# Patient Record
Sex: Male | Born: 1955 | Race: White | Hispanic: No | Marital: Single | Smoking: Never smoker
Health system: Southern US, Community
[De-identification: ages and names within clinical notes are randomized; demographics above are authoritative.]

## PROBLEM LIST (undated history)

## (undated) DIAGNOSIS — I10 Essential (primary) hypertension: Secondary | ICD-10-CM

## (undated) DIAGNOSIS — E119 Type 2 diabetes mellitus without complications: Secondary | ICD-10-CM

## (undated) DIAGNOSIS — I6529 Occlusion and stenosis of unspecified carotid artery: Secondary | ICD-10-CM

## (undated) DIAGNOSIS — E785 Hyperlipidemia, unspecified: Secondary | ICD-10-CM

## (undated) HISTORY — DX: Occlusion and stenosis of unspecified carotid artery: I65.29

## (undated) HISTORY — DX: Type 2 diabetes mellitus without complications: E11.9

## (undated) HISTORY — DX: Hyperlipidemia, unspecified: E78.5

---
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
## 2014-02-04 ENCOUNTER — Emergency Department (HOSPITAL_COMMUNITY): Payer: BLUE CROSS/BLUE SHIELD

## 2014-02-04 ENCOUNTER — Emergency Department (HOSPITAL_COMMUNITY)
Admission: EM | Admit: 2014-02-04 | Discharge: 2014-02-04 | Disposition: A | Discharge status: Home / Self Care | Payer: BLUE CROSS/BLUE SHIELD | Attending: Emergency Medicine | Admitting: Emergency Medicine

## 2014-02-04 ENCOUNTER — Encounter (HOSPITAL_COMMUNITY): Payer: Self-pay | Admitting: Emergency Medicine

## 2014-02-04 DIAGNOSIS — R1900 Intra-abdominal and pelvic swelling, mass and lump, unspecified site: Secondary | ICD-10-CM

## 2014-02-04 DIAGNOSIS — R000 Tachycardia, unspecified: Secondary | ICD-10-CM | POA: Insufficient documentation

## 2014-02-04 DIAGNOSIS — B029 Zoster without complications: Secondary | ICD-10-CM | POA: Diagnosis not present

## 2014-02-04 DIAGNOSIS — I10 Essential (primary) hypertension: Secondary | ICD-10-CM | POA: Insufficient documentation

## 2014-02-04 DIAGNOSIS — Z79899 Other long term (current) drug therapy: Secondary | ICD-10-CM | POA: Diagnosis not present

## 2014-02-04 DIAGNOSIS — R1032 Left lower quadrant pain: Secondary | ICD-10-CM | POA: Diagnosis present

## 2014-02-04 HISTORY — DX: Essential (primary) hypertension: I10

## 2014-02-04 LAB — COMPREHENSIVE METABOLIC PANEL
ALBUMIN: 4.3 g/dL (ref 3.5–5.2)
ALK PHOS: 55 U/L (ref 39–117)
ALT: 37 U/L (ref 0–53)
AST: 27 U/L (ref 0–37)
Anion gap: 9 (ref 5–15)
BILIRUBIN TOTAL: 0.8 mg/dL (ref 0.3–1.2)
BUN: 12 mg/dL (ref 6–23)
CALCIUM: 9.5 mg/dL (ref 8.4–10.5)
CHLORIDE: 105 mmol/L (ref 96–112)
CO2: 23 mmol/L (ref 19–32)
CREATININE: 1.08 mg/dL (ref 0.50–1.35)
GFR calc Af Amer: 85 mL/min — ABNORMAL LOW (ref >90)
GFR calc non Af Amer: 73 mL/min — ABNORMAL LOW (ref >90)
Glucose, Bld: 109 mg/dL — ABNORMAL HIGH (ref 70–99)
POTASSIUM: 4 mmol/L (ref 3.5–5.1)
Sodium: 137 mmol/L (ref 135–145)
Total Protein: 7.4 g/dL (ref 6.0–8.3)

## 2014-02-04 LAB — CBC WITH DIFFERENTIAL/PLATELET
Basophils Absolute: 0.1 10*3/uL (ref 0.0–0.1)
Basophils Relative: 1 % (ref 0–1)
EOS ABS: 0.2 10*3/uL (ref 0.0–0.7)
Eosinophils Relative: 2 % (ref 0–5)
HEMATOCRIT: 37.1 % — AB (ref 39.0–52.0)
HEMOGLOBIN: 13.1 g/dL (ref 13.0–17.0)
Lymphocytes Relative: 22 % (ref 12–46)
Lymphs Abs: 1.9 10*3/uL (ref 0.7–4.0)
MCH: 29.7 pg (ref 26.0–34.0)
MCHC: 35.3 g/dL (ref 30.0–36.0)
MCV: 84.1 fL (ref 78.0–100.0)
MONO ABS: 0.7 10*3/uL (ref 0.1–1.0)
Monocytes Relative: 8 % (ref 3–12)
NEUTROS ABS: 5.8 10*3/uL (ref 1.7–7.7)
NEUTROS PCT: 67 % (ref 43–77)
Platelets: 230 10*3/uL (ref 150–400)
RBC: 4.41 MIL/uL (ref 4.22–5.81)
RDW: 13.3 % (ref 11.5–15.5)
WBC: 8.7 10*3/uL (ref 4.0–10.5)

## 2014-02-04 LAB — I-STAT CHEM 8, ED
BUN: 15 mg/dL (ref 6–23)
CREATININE: 1.3 mg/dL (ref 0.50–1.35)
Calcium, Ion: 1.24 mmol/L — ABNORMAL HIGH (ref 1.12–1.23)
Chloride: 103 mmol/L (ref 96–112)
Glucose, Bld: 106 mg/dL — ABNORMAL HIGH (ref 70–99)
HCT: 42 % (ref 39.0–52.0)
HEMOGLOBIN: 14.3 g/dL (ref 13.0–17.0)
Potassium: 4.1 mmol/L (ref 3.5–5.1)
Sodium: 141 mmol/L (ref 135–145)
TCO2: 20 mmol/L (ref 0–100)

## 2014-02-04 LAB — URINALYSIS, ROUTINE W REFLEX MICROSCOPIC
Bilirubin Urine: NEGATIVE
Glucose, UA: NEGATIVE mg/dL
HGB URINE DIPSTICK: NEGATIVE
Ketones, ur: NEGATIVE mg/dL
LEUKOCYTES UA: NEGATIVE
NITRITE: NEGATIVE
PROTEIN: NEGATIVE mg/dL
SPECIFIC GRAVITY, URINE: 1.008 (ref 1.005–1.030)
Urobilinogen, UA: 0.2 mg/dL (ref 0.0–1.0)
pH: 5.5 (ref 5.0–8.0)

## 2014-02-04 MED ORDER — IOHEXOL 300 MG/ML  SOLN
25.0000 mL | Freq: Once | INTRAMUSCULAR | Status: AC | PRN
  Administered 2014-02-04: 25 mL via ORAL

## 2014-02-04 MED ORDER — IOHEXOL 300 MG/ML  SOLN
80.0000 mL | Freq: Once | INTRAMUSCULAR | Status: AC | PRN
  Administered 2014-02-04: 80 mL via INTRAVENOUS

## 2014-02-04 NOTE — ED Provider Notes (Addendum)
CSN: [id number]     Arrival date & time 02/04/14  4098 History   First MD Initiated Contact with Patient 02/04/14 [phone]     Chief Complaint  Patient presents with  . Abdominal Pain     (Consider location/radiation/quality/duration/timing/severity/associated sxs/prior Treatment) Patient is a 59 y.o. male presenting with abdominal pain. The history is provided by the patient.  Abdominal Pain Pain location:  LLQ Pain quality: sharp and shooting   Pain radiates to:  Groin Pain severity:  Severe Onset quality:  Gradual Duration:  2 weeks Timing:  Constant Progression:  Unchanged Chronicity:  New Context comment:  Patient noticed a rash 2 weeks ago to the left side of his abdomen and was diagnosed shingles. Since that time however he is noticing worsening swelling to the left side of his abdomen not improve with antibiotics which he took a full course or time Relieved by: Narcotics. Worsened by:  Movement Associated symptoms: diarrhea   Associated symptoms: no anorexia, no chills, no constipation, no cough, no nausea, no shortness of breath and no vomiting   Associated symptoms comment:  Only one episode of diarrhea but none since then Risk factors: no alcohol abuse and no recent hospitalization     Past Medical History  Diagnosis Date  . Hypertension    History reviewed. No pertinent past surgical history. No family history on file. History  Substance Use Topics  . Smoking status: Never Smoker   . Smokeless tobacco: Not on file  . Alcohol Use: No    Review of Systems  Constitutional: Negative for chills.  Respiratory: Negative for cough and shortness of breath.   Gastrointestinal: Positive for abdominal pain and diarrhea. Negative for nausea, vomiting, constipation and anorexia.  All other systems reviewed and are negative.     Allergies  Review of patient's allergies indicates no known allergies.  Home Medications   Prior to Admission medications   Medication Sig  Start Date End Date Taking? Authorizing Provider  amLODipine (NORVASC) 10 MG tablet Take 10 mg by mouth daily.   Yes Historical Provider, MD  atorvastatin (LIPITOR) 20 MG tablet Take 20 mg by mouth daily.   Yes Historical Provider, MD  POTASSIUM CHLORIDE PO Take 3 tablets by mouth daily.   Yes Historical Provider, MD  sertraline (ZOLOFT) 50 MG tablet Take 50 mg by mouth daily.   Yes Historical Provider, MD  valsartan (DIOVAN) 320 MG tablet Take 320 mg by mouth daily.   Yes Historical Provider, MD  PRESCRIPTION MEDICATION Take 1 tablet by mouth daily. Sertraline and cholesyerol    Historical Provider, MD   BP 125/83 mmHg  Pulse 104  Temp(Src) 98.3 F (36.8 C) (Oral)  Resp 20  SpO2 95% Physical Exam  Constitutional: He is oriented to person, place, and time. He appears well-developed and well-nourished. No distress.  HENT:  Head: Normocephalic and atraumatic.  Mouth/Throat: Oropharynx is clear and moist.  Eyes: Conjunctivae and EOM are normal. Pupils are equal, round, and reactive to light.  Neck: Normal range of motion. Neck supple.  Cardiovascular: Regular rhythm and intact distal pulses.  Tachycardia present.   No murmur heard. Pulmonary/Chest: Effort normal and breath sounds normal. No respiratory distress. He has no wheezes. He has no rales.  Abdominal: Soft. He exhibits no distension. There is no splenomegaly or hepatomegaly. There is no tenderness. There is no rebound and no guarding.    Musculoskeletal: Normal range of motion. He exhibits no edema or tenderness.  Neurological: He is alert and oriented  to person, place, and time.  Skin: Skin is warm and dry. No rash noted. No erythema.  Psychiatric: He has a normal mood and affect. His behavior is normal.  Nursing note and vitals reviewed.   ED Course  Procedures (including critical care time) Labs Review Labs Reviewed  CBC WITH DIFFERENTIAL/PLATELET - Abnormal; Notable for the following:    HCT 37.1 (*)    All other  components within normal limits  COMPREHENSIVE METABOLIC PANEL - Abnormal; Notable for the following:    Glucose, Bld 109 (*)    GFR calc non Af Amer 73 (*)    GFR calc Af Amer 85 (*)    All other components within normal limits  I-STAT CHEM 8, ED - Abnormal; Notable for the following:    Glucose, Bld 106 (*)    Calcium, Ion 1.24 (*)    All other components within normal limits  URINALYSIS, ROUTINE W REFLEX MICROSCOPIC    Imaging Review Ct Abdomen Pelvis W Contrast  02/04/2014   CLINICAL DATA:  LEFT side abdominal pain and swelling for several days, head diarrhea last week, active shingles LEFT side of abdomen, history hypertension  EXAM: CT ABDOMEN AND PELVIS WITH CONTRAST  TECHNIQUE: Multidetector CT imaging of the abdomen and pelvis was performed using the standard protocol following bolus administration of intravenous contrast. Sagittal and coronal MPR images reconstructed from axial data set.  CONTRAST:  80mL OMNIPAQUE IOHEXOL 300 MG/ML SOLN IV. Dilute oral contrast.  COMPARISON:  None  FINDINGS: 7 mm nodule base of RIGHT middle lobe image 1.  Liver, spleen, pancreas, kidneys, and adrenal glands normal.  Unremarkable bladder and ureters.  Few prostatic calcifications.  Scattered atherosclerotic calcifications without aneurysm.  Tiny LEFT inguinal hernia containing fat.  Stomach and bowel loops unremarkable.  Appendix not identified.  No mass, adenopathy, free fluid or inflammatory process.  Numerous scattered pelvic phleboliths.  No acute osseous findings.  IMPRESSION: No acute intra-abdominal or intrapelvic abnormalities.  Tiny LEFT inguinal hernia containing fat.  7 mm RIGHT lower lobe nodule, recommendation below.  If the patient is at high risk for bronchogenic carcinoma, follow-up chest CT at 3-356months is recommended. If the patient is at low risk for bronchogenic carcinoma, follow-up chest CT at 6-12 months is recommended. This recommendation follows the consensus statement: Guidelines for  Management of Small Pulmonary Nodules Detected on CT Scans: A Statement from the Fleischner Society as published in Radiology 2005; 237:395-400.   Electronically Signed   By: Ulyses SouthwardMark  Boles M.D.   On: 02/04/2014 11:48     EKG Interpretation None      MDM   Final diagnoses:  Abdominal swelling  Shingles    Patient with evidence of left-sided shingles that he's had for approximately 2 weeks presenting with left-sided abdominal swelling. Patient has significant swelling to the left side of his abdomen without evidence of abscess or fluid collection. Unable to palpate his spleen however given the extent of the swelling to his left side will do a CT to further evaluate. He denies any urinary or bowel symptoms. No fever at this time. No testicle or penis complaints.  12:01 PM Labs and CT neg.  Most likely loss of muscle tone caused by the shingles.  Will have pt continue valtrex and pain meds and f/u with pcp Patient is not a smoker and is at low risk for bronchogenic carcinoma she'll need a follow-up CT in 6-12 months.  Gwyneth SproutWhitney Antavia Tandy, MD 02/04/14 1201  Gwyneth SproutWhitney Marikay Roads, MD 02/04/14 [phone]  Gwyneth Sprout, MD 02/04/14 [phone]

## 2014-02-04 NOTE — ED Notes (Signed)
C/o left sided abd pain and swelling for several days, no vomiting or fever, diarrhea last week, also has active shingles on left side of abd, A/O X4, ambulatory and in NAD

## 2015-06-09 ENCOUNTER — Other Ambulatory Visit: Payer: Self-pay | Admitting: Family Medicine

## 2015-06-09 DIAGNOSIS — R911 Solitary pulmonary nodule: Secondary | ICD-10-CM

## 2015-07-14 ENCOUNTER — Ambulatory Visit
Admission: RE | Admit: 2015-07-14 | Discharge: 2015-07-14 | Disposition: A | Discharge status: Home / Self Care | Payer: BLUE CROSS/BLUE SHIELD | Source: Ambulatory Visit | Attending: Family Medicine | Admitting: Family Medicine

## 2015-07-14 DIAGNOSIS — R911 Solitary pulmonary nodule: Secondary | ICD-10-CM

## 2017-06-13 ENCOUNTER — Emergency Department (HOSPITAL_COMMUNITY): Payer: PRIVATE HEALTH INSURANCE

## 2017-06-13 ENCOUNTER — Encounter (HOSPITAL_COMMUNITY): Payer: Self-pay | Admitting: Emergency Medicine

## 2017-06-13 ENCOUNTER — Emergency Department (HOSPITAL_COMMUNITY)
Admission: EM | Admit: 2017-06-13 | Discharge: 2017-06-13 | Disposition: A | Discharge status: Home / Self Care | Payer: PRIVATE HEALTH INSURANCE | Attending: Emergency Medicine | Admitting: Emergency Medicine

## 2017-06-13 ENCOUNTER — Other Ambulatory Visit: Payer: Self-pay

## 2017-06-13 DIAGNOSIS — M25562 Pain in left knee: Secondary | ICD-10-CM | POA: Diagnosis not present

## 2017-06-13 DIAGNOSIS — I10 Essential (primary) hypertension: Secondary | ICD-10-CM | POA: Diagnosis not present

## 2017-06-13 DIAGNOSIS — Z79899 Other long term (current) drug therapy: Secondary | ICD-10-CM | POA: Insufficient documentation

## 2017-06-13 DIAGNOSIS — M25512 Pain in left shoulder: Secondary | ICD-10-CM | POA: Insufficient documentation

## 2017-06-13 DIAGNOSIS — M1710 Unilateral primary osteoarthritis, unspecified knee: Secondary | ICD-10-CM

## 2017-06-13 NOTE — ED Triage Notes (Signed)
Pt. Stated, Jimmy Callahan had groin pain for 2 weeks and my left knee is painful. I have no strength in my left shoulder

## 2017-06-13 NOTE — Discharge Instructions (Addendum)
Follow up as discussed. Take ibuprofen as discussed

## 2017-06-13 NOTE — ED Provider Notes (Signed)
[facility] EMERGENCY DEPARTMENT Provider Note   CSN: [id number] Arrival date & time: 06/13/17  6578     History   Chief Complaint Chief Complaint  Patient presents with  . Groin Pain  . Shoulder Pain    HPI Jimmy Callahan is a 62 y.o. male.  Pt comes in with c/o left shoulder, left knee and right groin pain that has been going on for a couple of weeks. 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 He has been taking ibuprofen. He states that on day he doesn't work the symptoms seem better. Denies redness, swelling or warmth to the left leg     Past Medical History:  Diagnosis Date  . Hypertension     There are no active problems to display for this patient.   History reviewed. No pertinent surgical history.      Home Medications    Prior to Admission medications   Medication Sig Start Date End Date Taking? Authorizing Provider  amLODipine (NORVASC) 10 MG tablet Take 10 mg by mouth daily.    [provider]  atorvastatin (LIPITOR) 20 MG tablet Take 20 mg by mouth daily.    [provider]  POTASSIUM CHLORIDE PO Take 3 tablets by mouth daily.    [provider]  PRESCRIPTION MEDICATION Take 1 tablet by mouth daily. Sertraline and cholesyerol    [provider]  sertraline (ZOLOFT) 50 MG tablet Take 50 mg by mouth daily.    [provider]  valsartan (DIOVAN) 320 MG tablet Take 320 mg by mouth daily.    [provider]    Family History No family history on file.  Social History Social History   Tobacco Use  . Smoking status: Never Smoker  . Smokeless tobacco: Never Used  Substance Use Topics  . Alcohol use: No  . Drug use: Not Currently     Allergies   Patient has no known allergies.   Review of Systems Review of Systems  All other systems reviewed and are negative.    Physical Exam Updated Vital Signs BP 122/87 (BP Location: Right Arm)    Pulse 100   Temp 98.2 F (36.8 C) (Oral)   Resp 16   Ht 5\' 11"  (1.803 m)   Wt 93 kg (205 lb)   SpO2 99%   BMI 28.59 kg/m   Physical Exam  Constitutional: He is oriented to person, place, and time. He appears well-developed and well-nourished.  Eyes: Pupils are equal, round, and reactive to light. EOM are normal.  Cardiovascular: Normal rate.  Pulmonary/Chest: Effort normal and breath sounds normal.  Abdominal: Soft. Bowel sounds are normal.  Musculoskeletal: Normal range of motion. He exhibits no edema.  Neurological: He is alert and oriented to person, place, and time.  Skin: Skin is warm.  Psychiatric: He has a normal mood and affect.  Nursing note and vitals reviewed.    ED Treatments / Results  Labs (all labs ordered are listed, but only abnormal results are displayed) Labs Reviewed - No data to display  EKG None  Radiology No results found.  Procedures Procedures (including critical care time)  Medications Ordered in ED Medications - No data to display   Initial Impression / Assessment and Plan / ED Course  I have reviewed the triage vital signs and the nursing notes.  Pertinent labs & imaging results that were available during my care of the patient were reviewed by me  and considered in my medical decision making (see chart for details).     Likely degenerative  Final Clinical Impressions(s) / ED Diagnoses   Final diagnoses:  None    ED Discharge Orders    None       Teressa Lowerickering, Janyiah Silveri, NP 06/13/17 1158    Donnetta Hutchingook, Brian, MD 06/15/17 1040

## 2017-07-22 DIAGNOSIS — R4020 Unspecified coma: Secondary | ICD-10-CM | POA: Diagnosis not present

## 2017-07-22 DIAGNOSIS — T50904A Poisoning by unspecified drugs, medicaments and biological substances, undetermined, initial encounter: Secondary | ICD-10-CM | POA: Diagnosis not present

## 2017-07-22 DIAGNOSIS — R0689 Other abnormalities of breathing: Secondary | ICD-10-CM | POA: Diagnosis not present

## 2017-09-12 IMAGING — CT CT CHEST W/O CM
1 series · 16 of 34 positions shown, 20 images · non-contrast
Comparison: CT scan of February 04, 2014.

CLINICAL DATA: Right lung nodule.

EXAM:
CT CHEST WITHOUT CONTRAST
TECHNIQUE: Multidetector CT imaging of the chest was performed following the
standard protocol without IV contrast.

[Series 2: chest w/(date) · axial · 0.80mm/px · z∈[-408,-76]mm · 16 of 187 slices shown, 20 images]
[im 14/187  mediastinal]
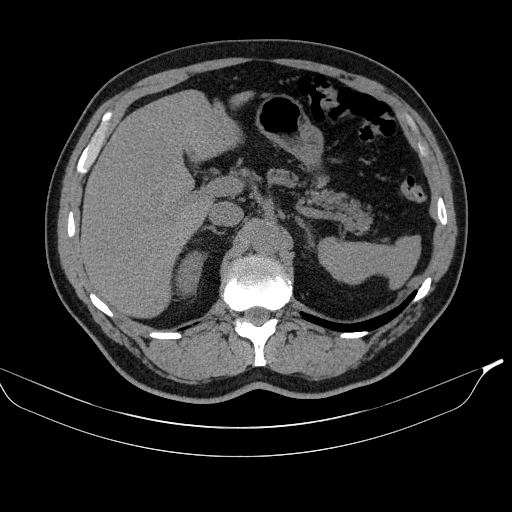
[im 14/187  lung]
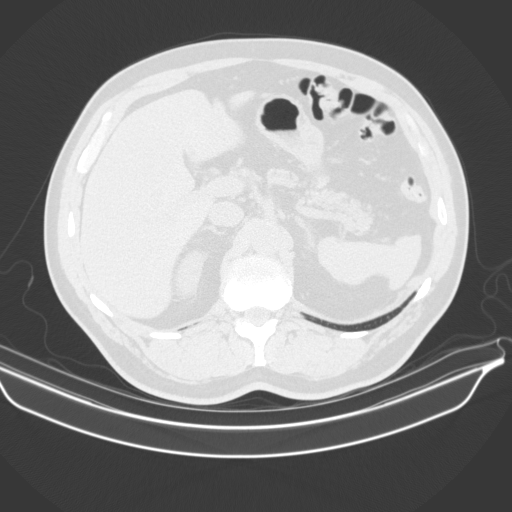
[im 28/187  lung]
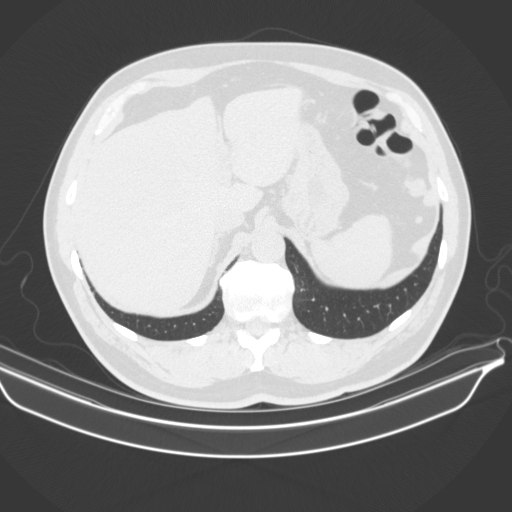
[im 38/187  lung]
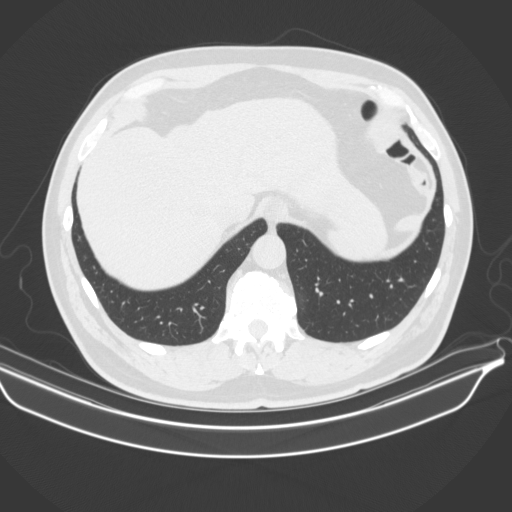
[im 49/187  lung]
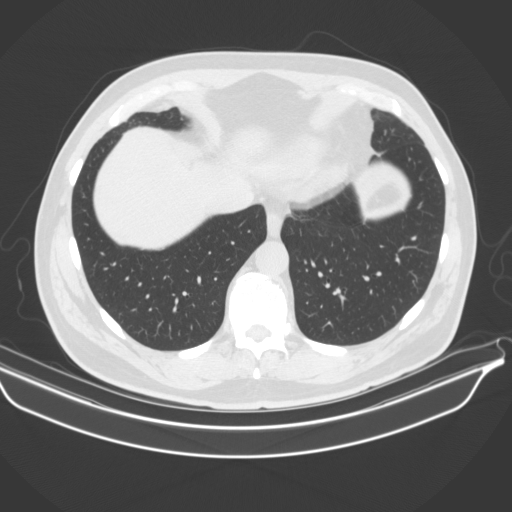
[im 63/187  mediastinal]
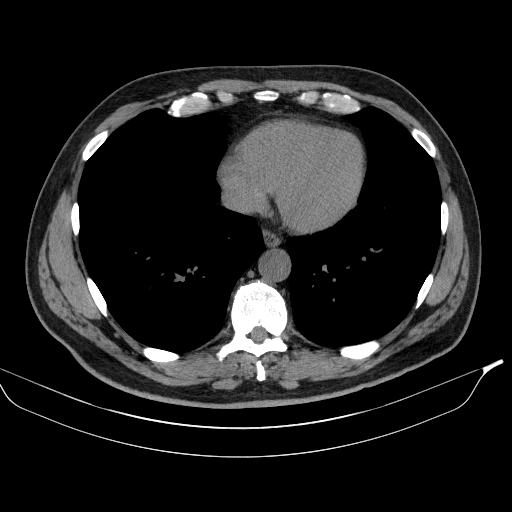
[im 63/187  lung]
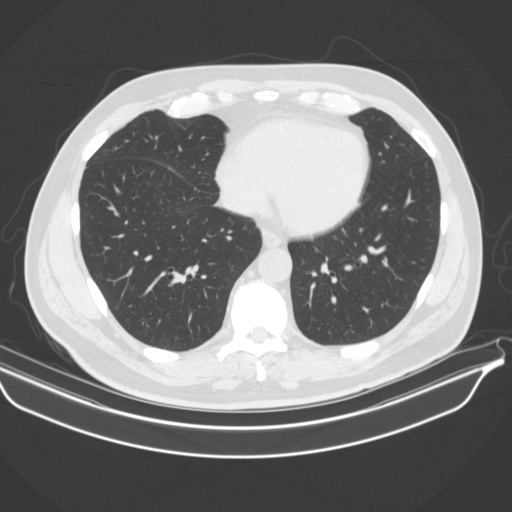
[im 75/187  lung]
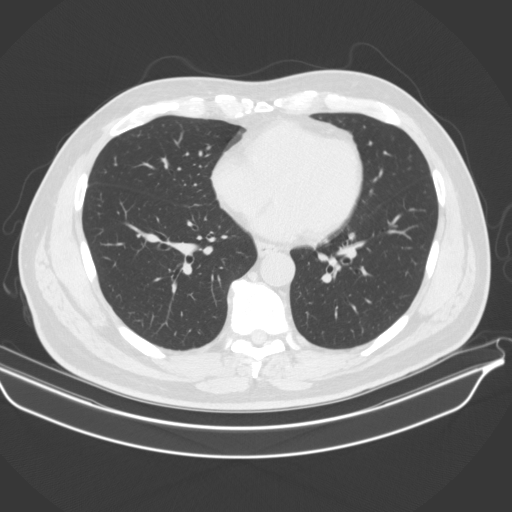
[im 83/187  lung]
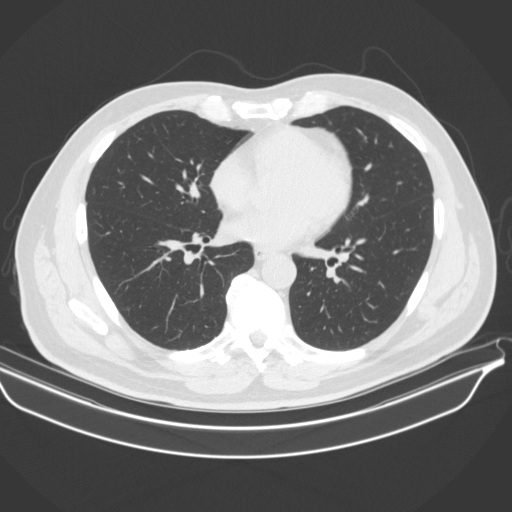
[im 90/187  lung]
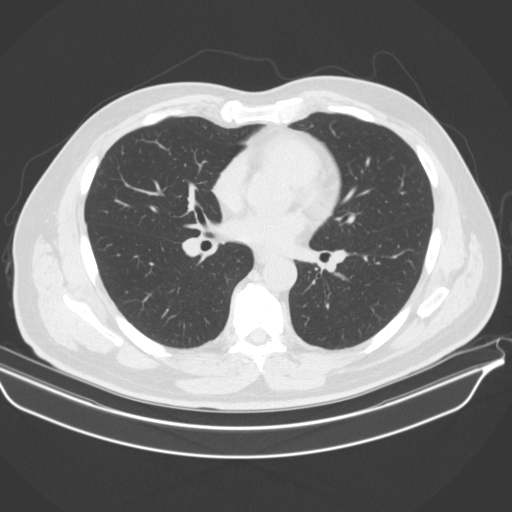
[im 98/187  mediastinal]
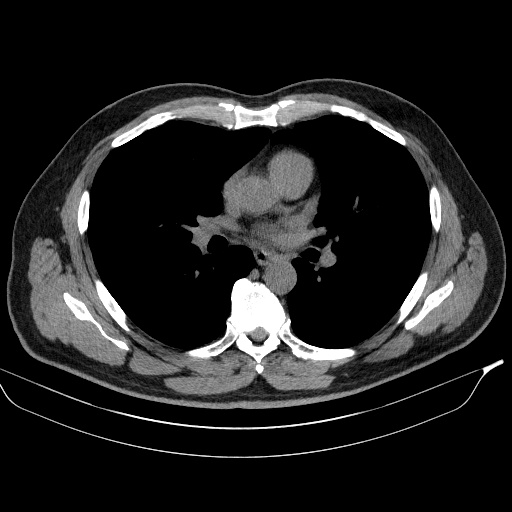
[im 98/187  lung]
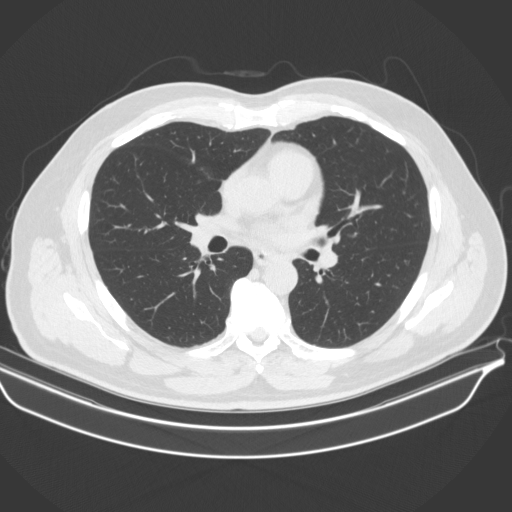
[im 111/187  lung]
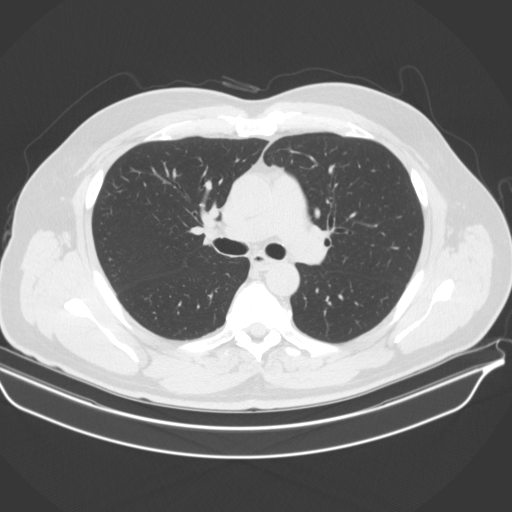
[im 118/187  lung]
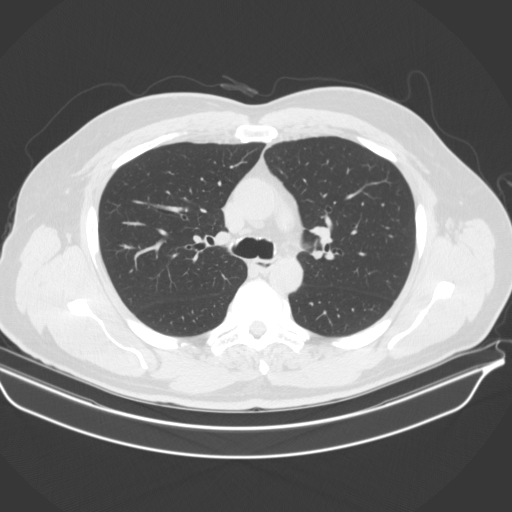
[im 131/187  lung]
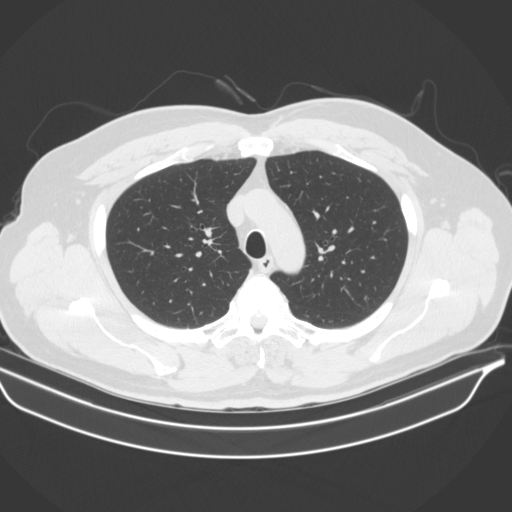
[im 145/187  mediastinal]
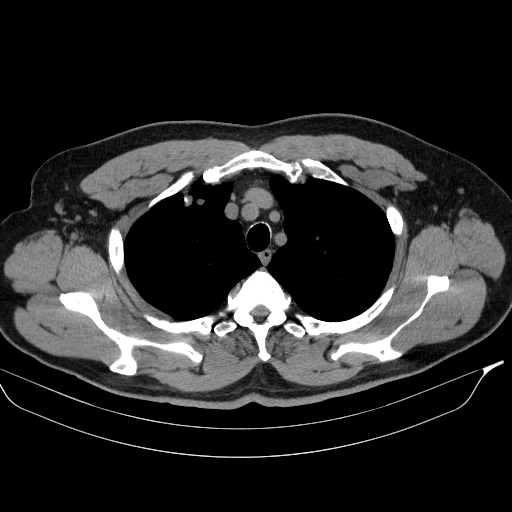
[im 145/187  lung]
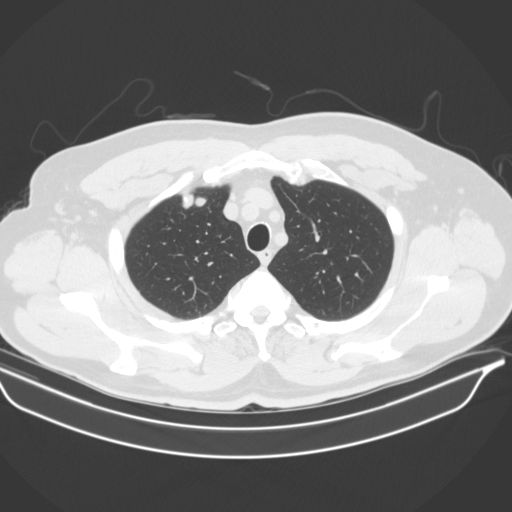
[im 152/187  lung]
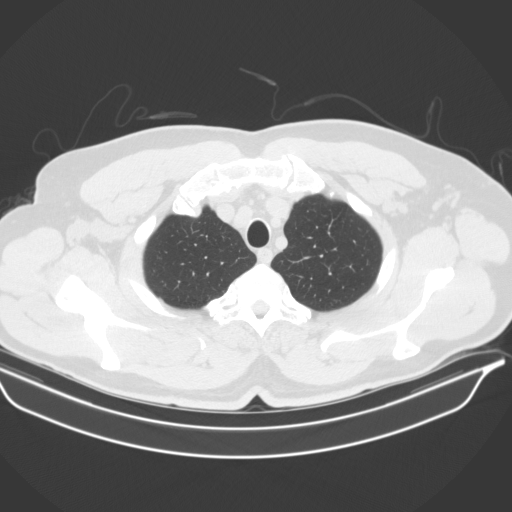
[im 166/187  lung]
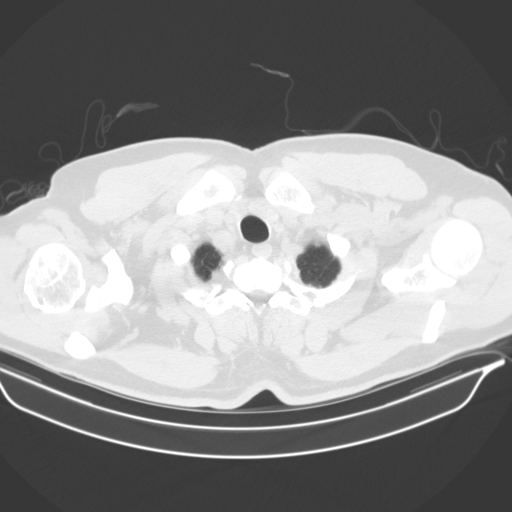
[im 180/187  lung]
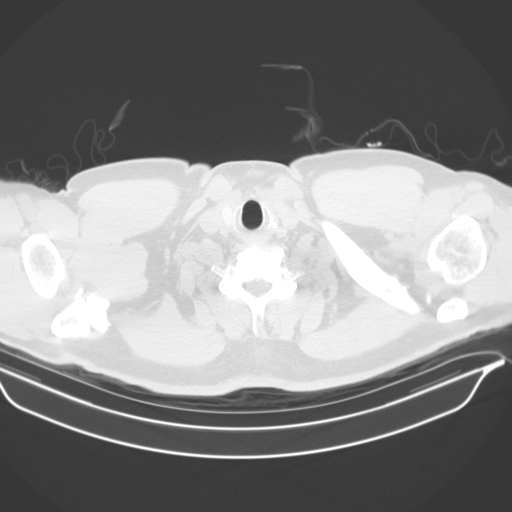

[16 of 34 positions shown; findings below may reference images not displayed]

FINDINGS: Cardiovascular: Atherosclerosis of thoracic aorta is noted without
aneurysm formation. Dissection cannot be excluded on these
nonenhanced images.

Mediastinum/Nodes: No mediastinal mass or adenopathy is noted.

Lungs/Pleura: No pneumothorax or pleural effusion is noted. Left
lung is clear. Stable 7 mm pleural-based nodule is noted anteriorly
in the right middle lobe best seen on image number 118 of series 5.

Upper Abdomen: Unremarkable.

Musculoskeletal: Anterior osteophyte formation is noted in the mid
thoracic spine.
IMPRESSION: Aortic atherosclerosis.

Stable 7 mm nodule seen in right middle lobe.

No other significant abnormality seen in the chest.

## 2018-09-18 DIAGNOSIS — E119 Type 2 diabetes mellitus without complications: Secondary | ICD-10-CM | POA: Diagnosis not present

## 2018-09-18 DIAGNOSIS — Z23 Encounter for immunization: Secondary | ICD-10-CM | POA: Diagnosis not present

## 2018-09-18 DIAGNOSIS — F419 Anxiety disorder, unspecified: Secondary | ICD-10-CM | POA: Diagnosis not present

## 2018-09-18 DIAGNOSIS — I10 Essential (primary) hypertension: Secondary | ICD-10-CM | POA: Diagnosis not present

## 2018-09-18 DIAGNOSIS — D72829 Elevated white blood cell count, unspecified: Secondary | ICD-10-CM | POA: Diagnosis not present

## 2018-11-20 DIAGNOSIS — Z23 Encounter for immunization: Secondary | ICD-10-CM | POA: Diagnosis not present

## 2019-08-13 IMAGING — CR DG SHOULDER 2+V*L*
3 series · 3 of 3 positions shown · non-contrast
Comparison: None.

CLINICAL DATA: Left shoulder pain

EXAM:
LEFT SHOULDER - 2+ VIEW

[shoulder y view]
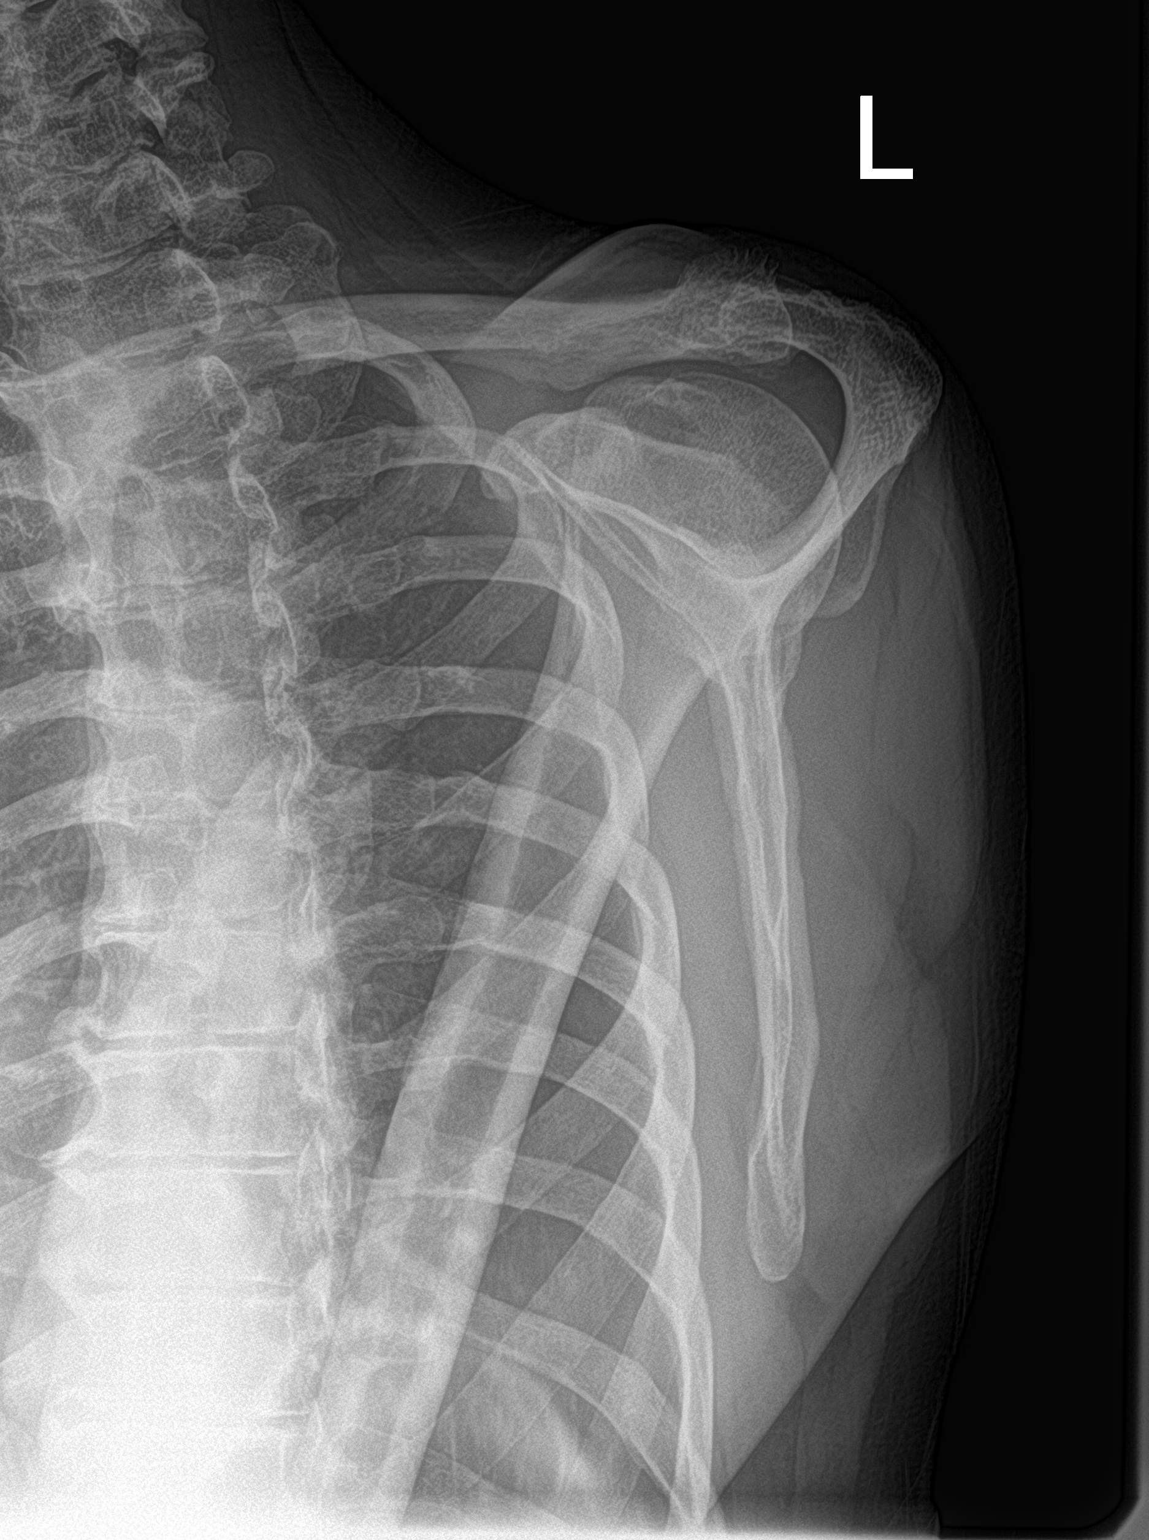

[shoulder grashey]
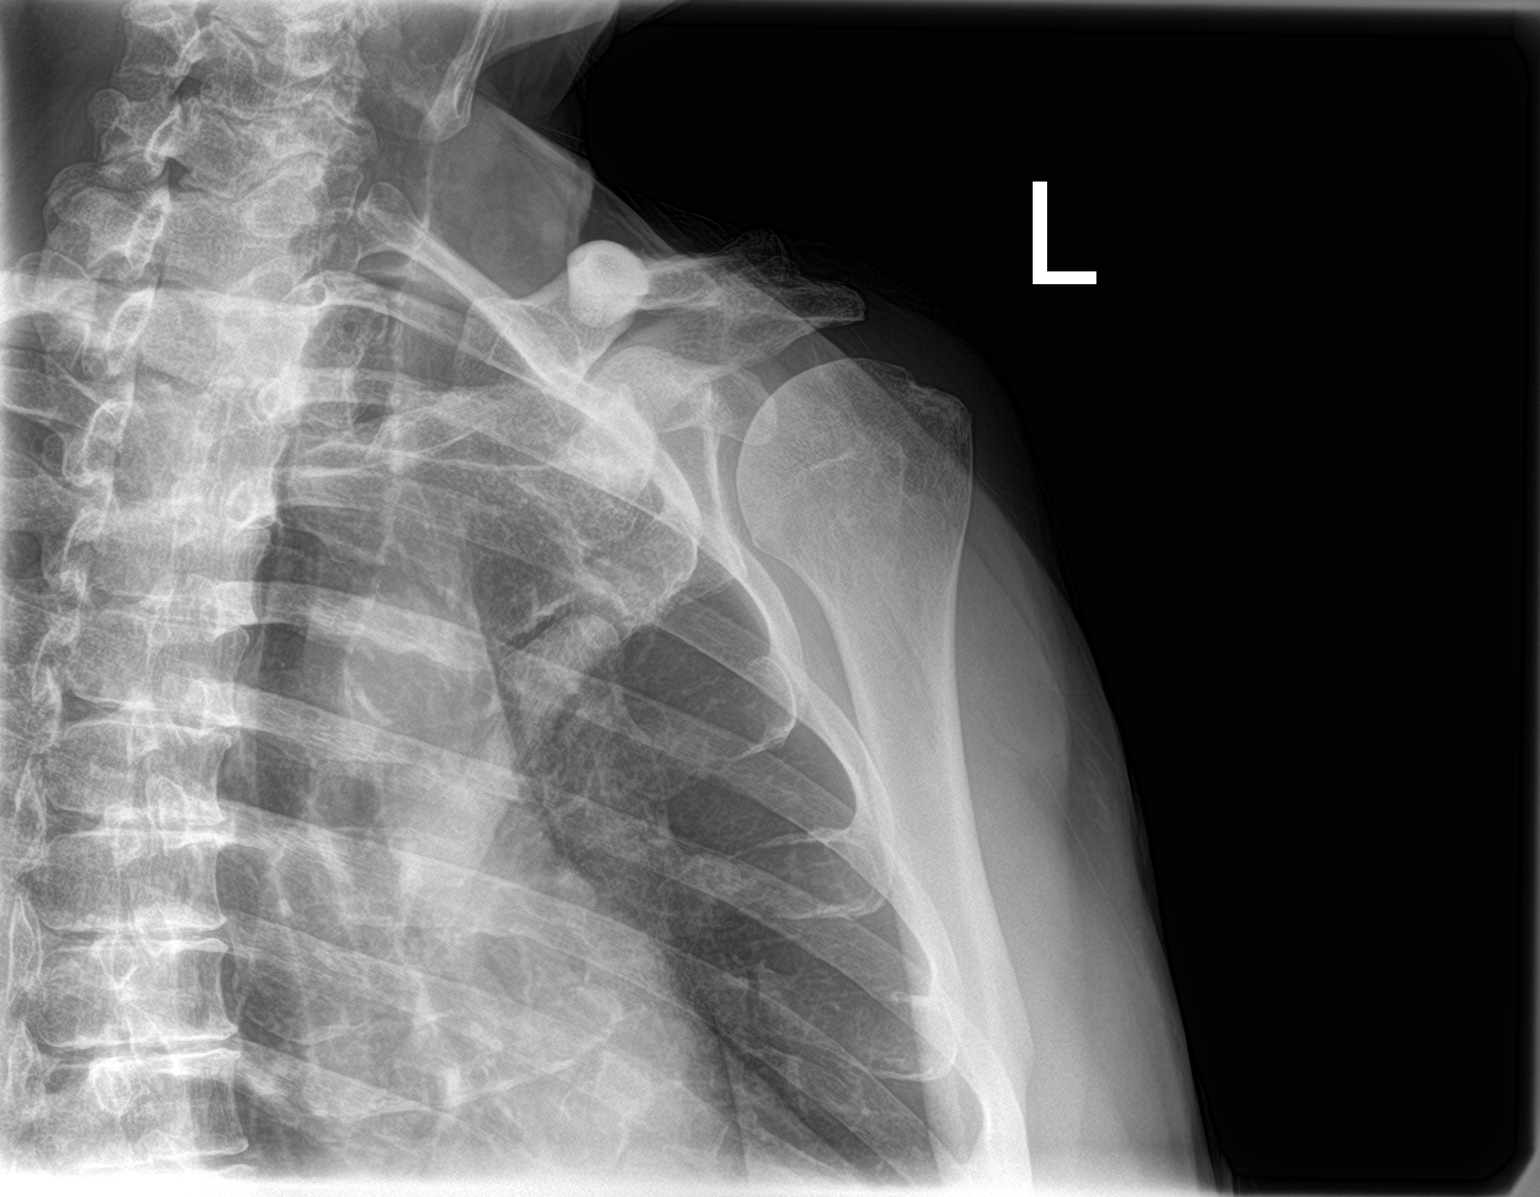

[shoulder axillary]
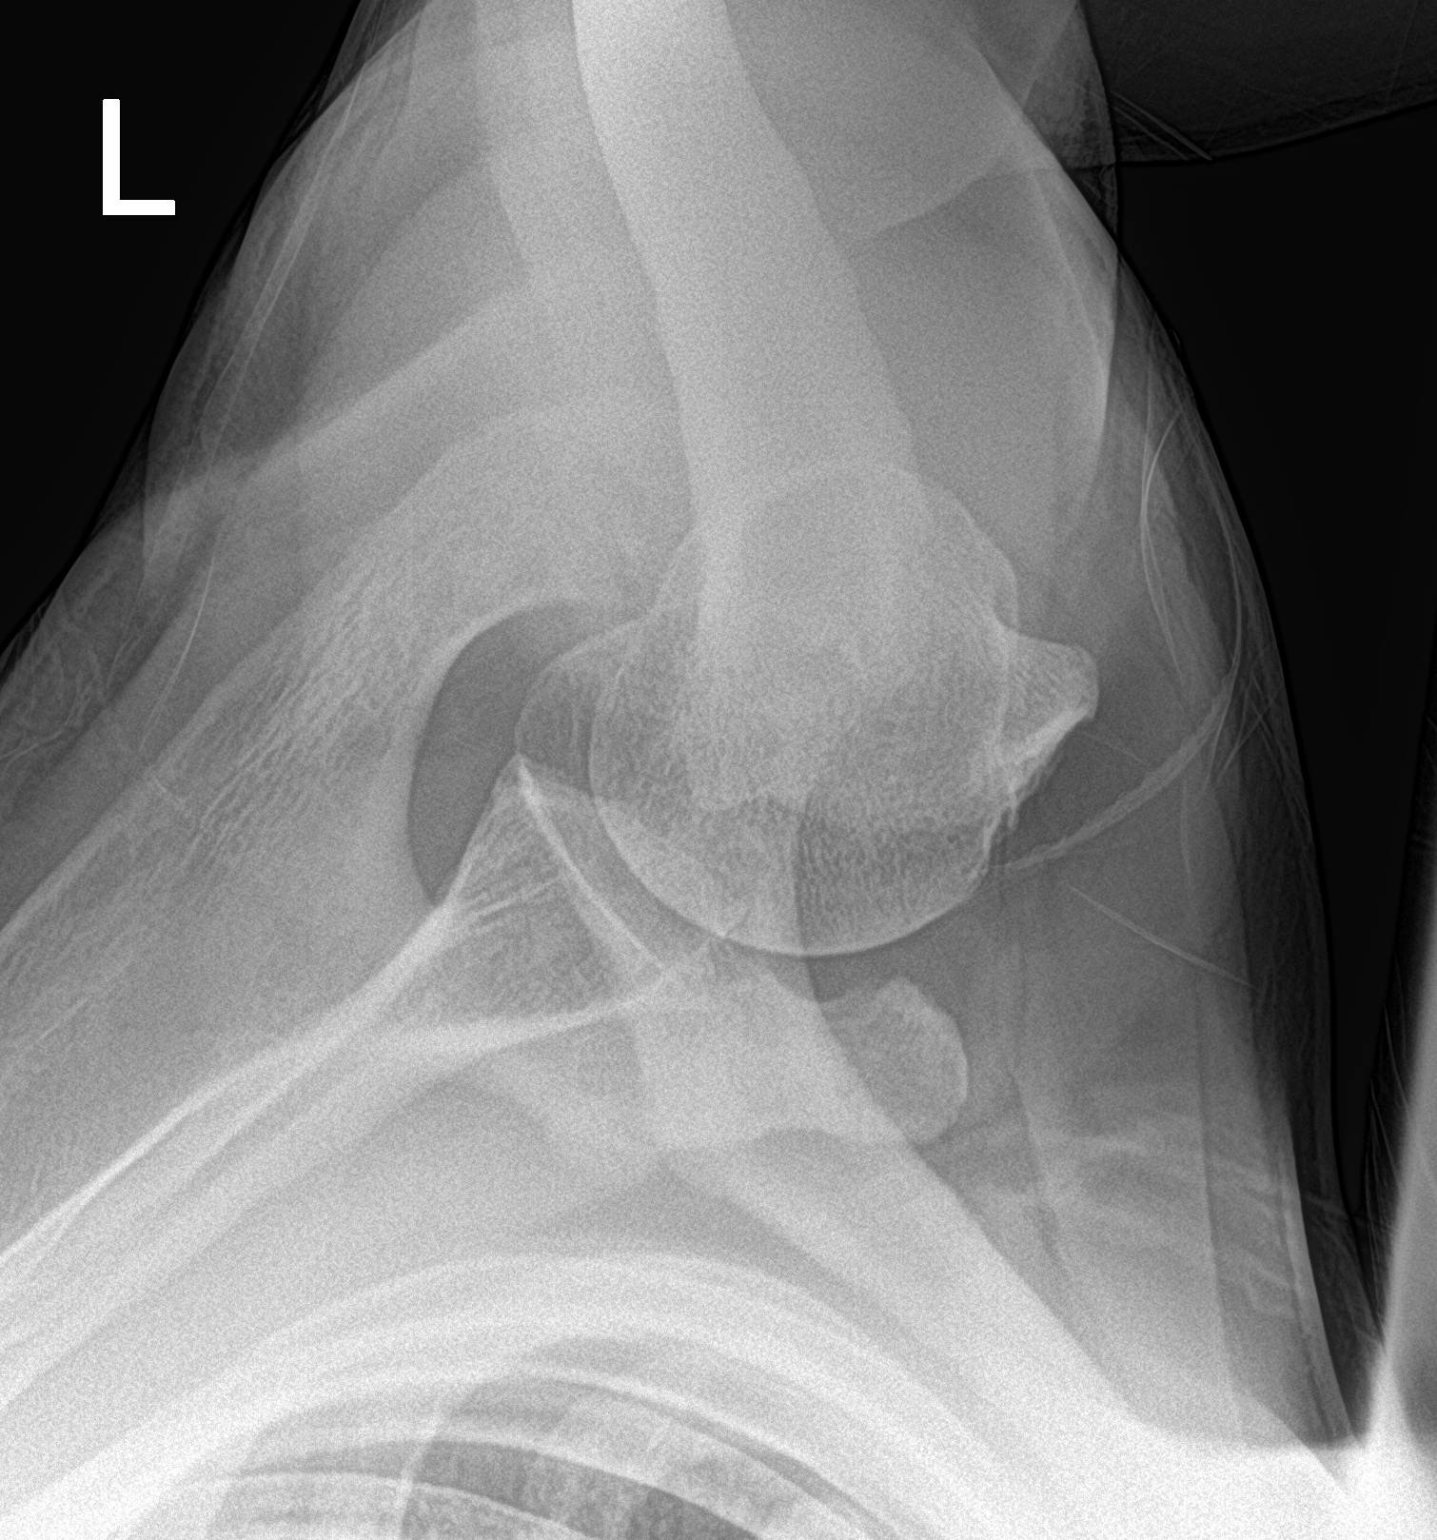

[3 of 3 positions shown; findings below may reference images not displayed]

FINDINGS: There is no evidence of fracture or dislocation. There is no
evidence of arthropathy or other focal bone abnormality. Soft
tissues are unremarkable.
IMPRESSION: No acute abnormality noted.

## 2020-04-02 DIAGNOSIS — Z23 Encounter for immunization: Secondary | ICD-10-CM | POA: Diagnosis not present

## 2020-04-02 DIAGNOSIS — I10 Essential (primary) hypertension: Secondary | ICD-10-CM | POA: Diagnosis not present

## 2020-04-02 DIAGNOSIS — E1169 Type 2 diabetes mellitus with other specified complication: Secondary | ICD-10-CM | POA: Diagnosis not present

## 2020-04-02 DIAGNOSIS — Z1159 Encounter for screening for other viral diseases: Secondary | ICD-10-CM | POA: Diagnosis not present

## 2020-04-02 DIAGNOSIS — Z0000 Encounter for general adult medical examination without abnormal findings: Secondary | ICD-10-CM | POA: Diagnosis not present

## 2020-04-02 DIAGNOSIS — Z125 Encounter for screening for malignant neoplasm of prostate: Secondary | ICD-10-CM | POA: Diagnosis not present

## 2020-04-02 DIAGNOSIS — F419 Anxiety disorder, unspecified: Secondary | ICD-10-CM | POA: Diagnosis not present

## 2020-04-02 DIAGNOSIS — E7800 Pure hypercholesterolemia, unspecified: Secondary | ICD-10-CM | POA: Diagnosis not present

## 2020-04-02 DIAGNOSIS — Z79899 Other long term (current) drug therapy: Secondary | ICD-10-CM | POA: Diagnosis not present

## 2020-10-03 DIAGNOSIS — E119 Type 2 diabetes mellitus without complications: Secondary | ICD-10-CM | POA: Diagnosis not present

## 2020-10-03 DIAGNOSIS — M5431 Sciatica, right side: Secondary | ICD-10-CM | POA: Diagnosis not present

## 2020-10-03 DIAGNOSIS — Z79899 Other long term (current) drug therapy: Secondary | ICD-10-CM | POA: Diagnosis not present

## 2020-10-03 DIAGNOSIS — F419 Anxiety disorder, unspecified: Secondary | ICD-10-CM | POA: Diagnosis not present

## 2020-10-03 DIAGNOSIS — I10 Essential (primary) hypertension: Secondary | ICD-10-CM | POA: Diagnosis not present

## 2020-11-12 DIAGNOSIS — E7800 Pure hypercholesterolemia, unspecified: Secondary | ICD-10-CM | POA: Diagnosis not present

## 2020-11-12 DIAGNOSIS — E1169 Type 2 diabetes mellitus with other specified complication: Secondary | ICD-10-CM | POA: Diagnosis not present

## 2020-11-12 DIAGNOSIS — Z79899 Other long term (current) drug therapy: Secondary | ICD-10-CM | POA: Diagnosis not present

## 2020-11-12 DIAGNOSIS — I10 Essential (primary) hypertension: Secondary | ICD-10-CM | POA: Diagnosis not present

## 2020-12-04 DIAGNOSIS — I10 Essential (primary) hypertension: Secondary | ICD-10-CM | POA: Diagnosis not present

## 2020-12-04 DIAGNOSIS — E7800 Pure hypercholesterolemia, unspecified: Secondary | ICD-10-CM | POA: Diagnosis not present

## 2020-12-04 DIAGNOSIS — E1169 Type 2 diabetes mellitus with other specified complication: Secondary | ICD-10-CM | POA: Diagnosis not present

## 2021-06-02 DIAGNOSIS — E1169 Type 2 diabetes mellitus with other specified complication: Secondary | ICD-10-CM | POA: Diagnosis not present

## 2021-06-02 DIAGNOSIS — E7800 Pure hypercholesterolemia, unspecified: Secondary | ICD-10-CM | POA: Diagnosis not present

## 2021-06-02 DIAGNOSIS — Z0000 Encounter for general adult medical examination without abnormal findings: Secondary | ICD-10-CM | POA: Diagnosis not present

## 2021-06-02 DIAGNOSIS — Z23 Encounter for immunization: Secondary | ICD-10-CM | POA: Diagnosis not present

## 2021-06-02 DIAGNOSIS — I10 Essential (primary) hypertension: Secondary | ICD-10-CM | POA: Diagnosis not present

## 2021-06-02 DIAGNOSIS — F419 Anxiety disorder, unspecified: Secondary | ICD-10-CM | POA: Diagnosis not present

## 2021-06-02 DIAGNOSIS — Z125 Encounter for screening for malignant neoplasm of prostate: Secondary | ICD-10-CM | POA: Diagnosis not present

## 2021-06-02 DIAGNOSIS — Z79899 Other long term (current) drug therapy: Secondary | ICD-10-CM | POA: Diagnosis not present

## 2021-06-17 DIAGNOSIS — Z79899 Other long term (current) drug therapy: Secondary | ICD-10-CM | POA: Diagnosis not present

## 2021-11-23 DIAGNOSIS — I10 Essential (primary) hypertension: Secondary | ICD-10-CM | POA: Diagnosis not present

## 2021-11-23 DIAGNOSIS — Z79899 Other long term (current) drug therapy: Secondary | ICD-10-CM | POA: Diagnosis not present

## 2021-11-23 DIAGNOSIS — E7800 Pure hypercholesterolemia, unspecified: Secondary | ICD-10-CM | POA: Diagnosis not present

## 2021-11-23 DIAGNOSIS — F419 Anxiety disorder, unspecified: Secondary | ICD-10-CM | POA: Diagnosis not present

## 2021-11-23 DIAGNOSIS — Z1211 Encounter for screening for malignant neoplasm of colon: Secondary | ICD-10-CM | POA: Diagnosis not present

## 2021-11-23 DIAGNOSIS — E1169 Type 2 diabetes mellitus with other specified complication: Secondary | ICD-10-CM | POA: Diagnosis not present

## 2022-11-03 DIAGNOSIS — E7800 Pure hypercholesterolemia, unspecified: Secondary | ICD-10-CM | POA: Diagnosis not present

## 2022-11-03 DIAGNOSIS — I700 Atherosclerosis of aorta: Secondary | ICD-10-CM | POA: Diagnosis not present

## 2022-11-03 DIAGNOSIS — F419 Anxiety disorder, unspecified: Secondary | ICD-10-CM | POA: Diagnosis not present

## 2022-11-03 DIAGNOSIS — E119 Type 2 diabetes mellitus without complications: Secondary | ICD-10-CM | POA: Diagnosis not present

## 2022-11-03 DIAGNOSIS — Z125 Encounter for screening for malignant neoplasm of prostate: Secondary | ICD-10-CM | POA: Diagnosis not present

## 2022-11-03 DIAGNOSIS — I10 Essential (primary) hypertension: Secondary | ICD-10-CM | POA: Diagnosis not present

## 2022-11-03 DIAGNOSIS — Z0001 Encounter for general adult medical examination with abnormal findings: Secondary | ICD-10-CM | POA: Diagnosis not present

## 2022-11-03 DIAGNOSIS — Z1211 Encounter for screening for malignant neoplasm of colon: Secondary | ICD-10-CM | POA: Diagnosis not present

## 2022-11-03 DIAGNOSIS — Z79899 Other long term (current) drug therapy: Secondary | ICD-10-CM | POA: Diagnosis not present

## 2022-11-05 DIAGNOSIS — Z1211 Encounter for screening for malignant neoplasm of colon: Secondary | ICD-10-CM | POA: Diagnosis not present

## 2022-12-10 DIAGNOSIS — E1165 Type 2 diabetes mellitus with hyperglycemia: Secondary | ICD-10-CM | POA: Diagnosis not present

## 2022-12-10 DIAGNOSIS — Z79899 Other long term (current) drug therapy: Secondary | ICD-10-CM | POA: Diagnosis not present

## 2022-12-10 DIAGNOSIS — Z794 Long term (current) use of insulin: Secondary | ICD-10-CM | POA: Diagnosis not present

## 2023-06-01 DIAGNOSIS — E1165 Type 2 diabetes mellitus with hyperglycemia: Secondary | ICD-10-CM | POA: Diagnosis not present

## 2023-06-01 DIAGNOSIS — Z79899 Other long term (current) drug therapy: Secondary | ICD-10-CM | POA: Diagnosis not present

## 2023-06-01 DIAGNOSIS — Z794 Long term (current) use of insulin: Secondary | ICD-10-CM | POA: Diagnosis not present

## 2023-07-12 DIAGNOSIS — E1165 Type 2 diabetes mellitus with hyperglycemia: Secondary | ICD-10-CM | POA: Diagnosis not present

## 2023-07-12 DIAGNOSIS — I10 Essential (primary) hypertension: Secondary | ICD-10-CM | POA: Diagnosis not present

## 2023-07-12 DIAGNOSIS — Z79899 Other long term (current) drug therapy: Secondary | ICD-10-CM | POA: Diagnosis not present

## 2023-07-12 DIAGNOSIS — Z794 Long term (current) use of insulin: Secondary | ICD-10-CM | POA: Diagnosis not present

## 2023-08-09 DIAGNOSIS — L738 Other specified follicular disorders: Secondary | ICD-10-CM | POA: Diagnosis not present

## 2023-08-09 DIAGNOSIS — L570 Actinic keratosis: Secondary | ICD-10-CM | POA: Diagnosis not present

## 2023-08-09 DIAGNOSIS — D485 Neoplasm of uncertain behavior of skin: Secondary | ICD-10-CM | POA: Diagnosis not present

## 2023-08-24 DIAGNOSIS — L578 Other skin changes due to chronic exposure to nonionizing radiation: Secondary | ICD-10-CM | POA: Diagnosis not present

## 2023-08-24 DIAGNOSIS — L570 Actinic keratosis: Secondary | ICD-10-CM | POA: Diagnosis not present

## 2023-10-05 DIAGNOSIS — Z872 Personal history of diseases of the skin and subcutaneous tissue: Secondary | ICD-10-CM | POA: Diagnosis not present

## 2023-10-05 DIAGNOSIS — Z09 Encounter for follow-up examination after completed treatment for conditions other than malignant neoplasm: Secondary | ICD-10-CM | POA: Diagnosis not present

## 2023-11-01 DIAGNOSIS — E119 Type 2 diabetes mellitus without complications: Secondary | ICD-10-CM | POA: Diagnosis not present

## 2023-11-01 DIAGNOSIS — H5310 Unspecified subjective visual disturbances: Secondary | ICD-10-CM | POA: Diagnosis not present

## 2023-11-08 ENCOUNTER — Ambulatory Visit (HOSPITAL_COMMUNITY)
Admission: RE | Admit: 2023-11-08 | Discharge: 2023-11-08 | Disposition: A | Discharge status: Home / Self Care | Payer: PRIVATE HEALTH INSURANCE | Source: Ambulatory Visit | Attending: Vascular Surgery | Admitting: Vascular Surgery

## 2023-11-08 ENCOUNTER — Other Ambulatory Visit (HOSPITAL_COMMUNITY): Payer: Self-pay | Admitting: Optometry

## 2023-11-08 DIAGNOSIS — G453 Amaurosis fugax: Secondary | ICD-10-CM | POA: Insufficient documentation

## 2023-11-10 ENCOUNTER — Other Ambulatory Visit: Payer: Self-pay | Admitting: Family Medicine

## 2023-11-10 DIAGNOSIS — Z79899 Other long term (current) drug therapy: Secondary | ICD-10-CM | POA: Diagnosis not present

## 2023-11-10 DIAGNOSIS — Z794 Long term (current) use of insulin: Secondary | ICD-10-CM | POA: Diagnosis not present

## 2023-11-10 DIAGNOSIS — I6521 Occlusion and stenosis of right carotid artery: Secondary | ICD-10-CM

## 2023-11-10 DIAGNOSIS — Z1211 Encounter for screening for malignant neoplasm of colon: Secondary | ICD-10-CM | POA: Diagnosis not present

## 2023-11-10 DIAGNOSIS — Z125 Encounter for screening for malignant neoplasm of prostate: Secondary | ICD-10-CM | POA: Diagnosis not present

## 2023-11-10 DIAGNOSIS — E1165 Type 2 diabetes mellitus with hyperglycemia: Secondary | ICD-10-CM | POA: Diagnosis not present

## 2023-11-10 DIAGNOSIS — E119 Type 2 diabetes mellitus without complications: Secondary | ICD-10-CM | POA: Diagnosis not present

## 2023-11-10 DIAGNOSIS — F419 Anxiety disorder, unspecified: Secondary | ICD-10-CM | POA: Diagnosis not present

## 2023-11-10 DIAGNOSIS — G453 Amaurosis fugax: Secondary | ICD-10-CM

## 2023-11-10 DIAGNOSIS — Z0001 Encounter for general adult medical examination with abnormal findings: Secondary | ICD-10-CM | POA: Diagnosis not present

## 2023-11-10 DIAGNOSIS — I10 Essential (primary) hypertension: Secondary | ICD-10-CM | POA: Diagnosis not present

## 2023-11-10 DIAGNOSIS — E7800 Pure hypercholesterolemia, unspecified: Secondary | ICD-10-CM | POA: Diagnosis not present

## 2023-11-17 ENCOUNTER — Ambulatory Visit
Admission: RE | Admit: 2023-11-17 | Discharge: 2023-11-17 | Disposition: A | Source: Ambulatory Visit | Attending: Family Medicine | Admitting: Family Medicine

## 2023-11-17 DIAGNOSIS — G453 Amaurosis fugax: Secondary | ICD-10-CM

## 2023-11-17 DIAGNOSIS — I6522 Occlusion and stenosis of left carotid artery: Secondary | ICD-10-CM | POA: Diagnosis not present

## 2023-11-17 DIAGNOSIS — I6521 Occlusion and stenosis of right carotid artery: Secondary | ICD-10-CM

## 2023-11-17 MED ORDER — IOPAMIDOL (ISOVUE-370) INJECTION 76%
75.0000 mL | Freq: Once | INTRAVENOUS | Status: AC | PRN
  Administered 2023-11-17: 75 mL via INTRAVENOUS

## 2023-11-22 ENCOUNTER — Other Ambulatory Visit (HOSPITAL_COMMUNITY): Payer: Self-pay | Admitting: Optometry

## 2023-11-22 ENCOUNTER — Ambulatory Visit (HOSPITAL_COMMUNITY)
Admission: RE | Admit: 2023-11-22 | Discharge: 2023-11-22 | Disposition: A | Discharge status: Home / Self Care | Payer: PRIVATE HEALTH INSURANCE | Source: Ambulatory Visit | Attending: Cardiology | Admitting: Cardiology

## 2023-11-22 DIAGNOSIS — G453 Amaurosis fugax: Secondary | ICD-10-CM | POA: Insufficient documentation

## 2023-11-22 LAB — ECHOCARDIOGRAM COMPLETE
Area-P 1/2: 2.11 cm2
S' Lateral: 3 cm

## 2023-11-23 ENCOUNTER — Telehealth (HOSPITAL_COMMUNITY): Payer: Self-pay | Admitting: *Deleted

## 2023-11-24 ENCOUNTER — Encounter: Payer: Self-pay | Admitting: Vascular Surgery

## 2023-11-24 ENCOUNTER — Other Ambulatory Visit (HOSPITAL_COMMUNITY): Payer: Self-pay

## 2023-11-24 ENCOUNTER — Ambulatory Visit: Attending: Vascular Surgery | Admitting: Vascular Surgery

## 2023-11-24 VITALS — BP 139/78 | HR 88 | Temp 98.3°F | Resp 18 | Ht 71.0 in | Wt 196.5 lb

## 2023-11-24 DIAGNOSIS — I6522 Occlusion and stenosis of left carotid artery: Secondary | ICD-10-CM

## 2023-11-24 MED ORDER — ASPIRIN 81 MG PO TBEC
81.0000 mg | DELAYED_RELEASE_TABLET | Freq: Every day | ORAL | 12 refills | Status: DC
  Filled 2023-11-24: qty 30, 30d supply, fill #0
  Filled 2023-12-22: qty 30, 30d supply, fill #1

## 2023-11-24 MED ORDER — CLOPIDOGREL BISULFATE 75 MG PO TABS
75.0000 mg | ORAL_TABLET | Freq: Every day | ORAL | 6 refills | Status: DC
  Filled 2023-11-24: qty 30, 30d supply, fill #0
  Filled 2023-12-22: qty 30, 30d supply, fill #1

## 2023-11-24 NOTE — Progress Notes (Signed)
 Office Note     CC: Symptomatic left ICA stenosis Requesting Provider:  [facility]*  HPI: Jimmy Callahan is a 68 y.o. ([birth year]) male presenting at the request of .Jimmy Callahan, Jimmy Callahan for evaluation of symptomatic left ICA stenosis.  Roughly 1 month ago, Jimmy Callahan had acute blindness of the left eye.  He was seen by his ophthalmologist who could not find Callahan ocular etiology.  He recommended assessing the carotid arteries.  String sign was appreciated in the left internal carotid artery.  Jimmy Callahan presents my office today with symptomatic left ICA stenosis.  On exam, Jimmy Callahan was doing well.  He had no complaints.  Fortunately left eye blindness was transient.  He has had no further episodes.  Denies symptoms of TIA, stroke, amaurosis otherwise.  Occasions include statin. He is a non-smoker  Past Medical History:  Diagnosis Date   Carotid artery occlusion    Diabetes mellitus without complication (HCC)    Hyperlipidemia    Hypertension     Past Surgical History:  Procedure Laterality Date   HERNIA REPAIR Bilateral 1971    Social History   Socioeconomic History   Marital status: Single    Spouse name: Not on file   Number of children: Not on file   Years of education: Not on file   Highest education level: Not on file  Occupational History   Not on file  Tobacco Use   Smoking status: Never   Smokeless tobacco: Never  Vaping Use   Vaping status: Never Used  Substance and Sexual Activity   Alcohol use: No   Drug use: Not Currently   Sexual activity: Not on file  Other Topics Concern   Not on file  Social History Narrative   Not on file   Social Drivers of Health   Financial Resource Strain: Not on file  Food Insecurity: Not on file  Transportation Needs: Not on file  Physical Activity: Not on file  Stress: Not on file  Social Connections: Not on file  Intimate Partner Violence: Not on file  History reviewed. No pertinent family  history.  Current Outpatient Medications  Medication Sig Dispense Refill   amLODipine (NORVASC) 10 MG tablet Take 10 mg by mouth daily.     atorvastatin (LIPITOR) 80 MG tablet Take 80 mg by mouth daily.     escitalopram (LEXAPRO) 5 MG tablet Take 5 mg by mouth daily.     JARDIANCE 25 MG TABS tablet Take 10 mg by mouth daily.     LANTUS SOLOSTAR 100 UNIT/ML Solostar Pen Inject 15 Units into the skin daily.     metFORMIN (GLUCOPHAGE) 1000 MG tablet Take 500 mg by mouth 2 (two) times daily with a meal.     POTASSIUM CHLORIDE PO Take 3 tablets by mouth daily.     sertraline (ZOLOFT) 50 MG tablet Take 50 mg by mouth daily.     valsartan (DIOVAN) 320 MG tablet Take 320 mg by mouth daily.     No current facility-administered medications for this visit.    No Known Allergies   REVIEW OF SYSTEMS:  [X]  denotes positive finding, [ ]  denotes negative finding Cardiac  Comments:  Chest pain or chest pressure:    Shortness of breath upon exertion:    Short of breath when lying flat:    Irregular heart rhythm:        Vascular    Pain in calf, thigh, or hip brought on by ambulation:    Pain  in feet at night that wakes you up from your sleep:     Blood clot in your veins:    Leg swelling:         Pulmonary    Oxygen at home:    Productive cough:     Wheezing:         Neurologic    Sudden weakness in arms or legs:     Sudden numbness in arms or legs:     Sudden onset of difficulty speaking or slurred speech:    Temporary loss of vision in one eye:     Problems with dizziness:         Gastrointestinal    Blood in stool:     Vomited blood:         Genitourinary    Burning when urinating:     Blood in urine:        Psychiatric    Major depression:         Hematologic    Bleeding problems:    Problems with blood clotting too easily:        Skin    Rashes or ulcers:        Constitutional    Fever or chills:      PHYSICAL EXAMINATION:  Vitals:   11/24/23 1454 11/24/23  1457  BP: (!) 140/83 139/78  Pulse: 88   Resp: 18   Temp: 98.3 F (36.8 C)   TempSrc: Temporal   SpO2: 96%   Weight: 196 lb 8 oz (89.1 kg)   Height: 5' 11 (1.803 m)     General:  WDWN in NAD; vital signs documented above Gait: Not observed HENT: WNL, normocephalic Pulmonary: normal non-labored breathing , without wheezing Cardiac: regular HR Abdomen: soft, NT, no masses Skin: without rashes Vascular Exam/Pulses:  Right Left  Radial 2+ (normal) 2+ (normal)                       Extremities: without ischemic changes, without Gangrene , without cellulitis; without open wounds;  Musculoskeletal: no muscle wasting or atrophy  Neurologic: A&O X 3;  No focal weakness or paresthesias are detected Psychiatric:  The pt has Normal affect.   Non-Invasive Vascular Imaging:   Near occlusion of the left internal carotid artery.  There is appear to be flow lumen.  Some level of stenosis greater than 99%.    ASSESSMENT/PLAN: Jimmy Callahan is a 67 y.o. male presenting with symptomatic left ICA stenosis.  I had a long discussion with Jimmy Callahan regarding the above.  He is aware that over the next 2 years, with medical management, he has a 26% risk of stroke.  With intervention, this is decreased to 9%.    We discussed both carotid endarterectomy and TCAR.  He would like to have the lesion treated.  After discussing both and using shared decision making, Jimmy Callahan elected to proceed with transcarotid artery revascularization.  Will schedule in the coming weeks.  I have ordered both aspirin  and Plavix  for him to begin taking on a daily basis.  I asked that he continue his high intensity statin.   Jimmy FORBES Rim, MD [facility] [phone]

## 2023-11-28 ENCOUNTER — Other Ambulatory Visit: Payer: Self-pay

## 2023-11-28 DIAGNOSIS — I6522 Occlusion and stenosis of left carotid artery: Secondary | ICD-10-CM

## 2023-11-29 NOTE — Pre-Procedure Instructions (Signed)
 Surgical Instructions   Your procedure is scheduled on Monday, December 1st.   Report to [facility] Main Entrance A at 0530 A.M., then check in with the Admitting office. Any questions or running late day of surgery: call [phone]  Questions prior to your surgery date: call [phone], Monday-Friday, 8am-4pm. If you experience any cold or flu symptoms such as cough, fever, chills, shortness of breath, etc. between now and your scheduled surgery, please notify us  at the above number.     Remember:  Do not eat or drink after midnight the night before your surgery  Take these medicines the morning of surgery with A SIP OF WATER:            amLODipine (NORVASC)             aspirin              atorvastatin (LIPITOR)             clopidogrel  (PLAVIX )             escitalopram (LEXAPRO)    One week prior to surgery, STOP taking any Aleve, Naproxen, Ibuprofen, Motrin, Advil, Goody's, BC's, all herbal medications, fish oil, and non-prescription vitamins.          WHAT DO I DO ABOUT MY DIABETES MEDICATION?  Please hold JARDIANCE for 72 hours before your surgery. Last dose should be on November 27.  Do not take oral diabetes medicines (pills) the morning of surgery. Last dose of metFromin should be on November 30.   THE NIGHT BEFORE SURGERY, take 8 units of LANTUS insulin.       THE MORNING OF SURGERY, take 8 units of LANTUS insulin.    HOW TO MANAGE YOUR DIABETES BEFORE AND AFTER SURGERY  Why is it important to control my blood sugar before and after surgery? Improving blood sugar levels before and after surgery helps healing and can limit problems. A way of improving blood sugar control is eating a healthy diet by:  Eating less sugar and carbohydrates  Increasing activity/exercise  Talking with your doctor about reaching your blood sugar goals High blood sugars (greater than 180 mg/dL) can raise your risk of infections and slow your recovery, so you will need to focus on  controlling your diabetes during the weeks before surgery. Make sure that the doctor who takes care of your diabetes knows about your planned surgery including the date and location.  How do I manage my blood sugar before surgery? Check your blood sugar at least 4 times a day, starting 2 days before surgery, to make sure that the level is not too high or low.  Check your blood sugar the morning of your surgery when you wake up and every 2 hours until you get to the Short Stay unit.  If your blood sugar is less than 70 mg/dL, you will need to treat for low blood sugar: Do not take insulin. Treat a low blood sugar (less than 70 mg/dL) with  cup of clear juice (cranberry or apple), 4 glucose tablets, OR glucose gel. Recheck blood sugar in 15 minutes after treatment (to make sure it is greater than 70 mg/dL). If your blood sugar is not greater than 70 mg/dL on recheck, call [phone] for further instructions. Report your blood sugar to the short stay nurse when you get to Short Stay.  If you are admitted to the hospital after surgery: Your blood sugar will be checked by the staff and you will probably be  given insulin after surgery (instead of oral diabetes medicines) to make sure you have good blood sugar levels. The goal for blood sugar control after surgery is 80-180 mg/dL.            Do NOT Smoke (Tobacco/Vaping) for 24 hours prior to your procedure.  If you use a CPAP at night, you may bring your mask/headgear for your overnight stay.   You will be asked to remove any contacts, glasses, piercing's, hearing aid's, dentures/partials prior to surgery. Please bring cases for these items if needed.    Patients discharged the day of surgery will not be allowed to drive home, and someone needs to stay with them for 24 hours.  SURGICAL WAITING ROOM VISITATION Patients may have no more than 2 support people in the waiting area - these visitors may rotate.   Pre-op nurse will coordinate an  appropriate time for 1 ADULT support person, who may not rotate, to accompany patient in pre-op.  Children under the age of 50 must have an adult with them who is not the patient and must remain in the main waiting area with an adult.  If the patient needs to stay at the hospital during part of their recovery, the visitor guidelines for inpatient rooms apply.  Please refer to the [facility] website for the visitor guidelines for any additional information.   If you received a COVID test during your pre-op visit  it is requested that you wear a mask when out in public, stay away from anyone that may not be feeling well and notify your surgeon if you develop symptoms. If you have been in contact with anyone that has tested positive in the last 10 days please notify you surgeon.      Pre-operative CHG Bathing Instructions   You can play a key role in reducing the risk of infection after surgery. Your skin needs to be as free of germs as possible. You can reduce the number of germs on your skin by washing with CHG (chlorhexidine gluconate) soap before surgery. CHG is an antiseptic soap that kills germs and continues to kill germs even after washing.   DO NOT use if you have an allergy to chlorhexidine/CHG or antibacterial soaps. If your skin becomes reddened or irritated, stop using the CHG and notify one of our RNs at [phone].              TAKE A SHOWER THE NIGHT BEFORE SURGERY   Please keep in mind the following:  DO NOT shave, including legs and underarms, 48 hours prior to surgery.   You may shave your face before/day of surgery.  Place clean sheets on your bed the night before surgery Use a clean washcloth (not used since being washed) for shower. DO NOT sleep with pet's night before surgery.  CHG Shower Instructions:  Wash your face and private area with normal soap. If you choose to wash your hair, wash first with your normal shampoo.  After you use shampoo/soap, rinse your  hair and body thoroughly to remove shampoo/soap residue.  Turn the water OFF and apply half the bottle of CHG soap to a CLEAN washcloth.  Apply CHG soap ONLY FROM YOUR NECK DOWN TO YOUR TOES (washing for 3-5 minutes)  DO NOT use CHG soap on face, private areas, open wounds, or sores.  Pay special attention to the area where your surgery is being performed.  If you are having back surgery, having someone wash your back for  you may be helpful. Wait 2 minutes after CHG soap is applied, then you may rinse off the CHG soap.  Pat dry with a clean towel  Put on clean pajamas    Additional instructions for the day of surgery: If you choose, you may shower the morning of surgery with an antibacterial soap.  DO NOT APPLY any lotions, deodorants, cologne, or perfumes.   Do not wear jewelry or makeup Do not wear nail polish, gel polish, artificial nails, or any other type of covering on natural nails (fingers and toes) Do not bring valuables to the hospital. [facility] is not responsible for valuables/personal belongings. Put on clean/comfortable clothes.  Please brush your teeth.  Ask your nurse before applying any prescription medications to the skin.

## 2023-11-30 ENCOUNTER — Encounter (HOSPITAL_COMMUNITY)
Admission: RE | Admit: 2023-11-30 | Discharge: 2023-11-30 | Disposition: A | Discharge status: Home / Self Care | Source: Ambulatory Visit | Attending: Vascular Surgery | Admitting: Vascular Surgery

## 2023-11-30 ENCOUNTER — Other Ambulatory Visit: Payer: Self-pay

## 2023-11-30 ENCOUNTER — Encounter (HOSPITAL_COMMUNITY): Payer: Self-pay

## 2023-11-30 VITALS — BP 122/88 | HR 84 | Temp 98.0°F | Resp 19 | Ht 71.0 in | Wt 196.0 lb

## 2023-11-30 DIAGNOSIS — I10 Essential (primary) hypertension: Secondary | ICD-10-CM | POA: Insufficient documentation

## 2023-11-30 DIAGNOSIS — Z7984 Long term (current) use of oral hypoglycemic drugs: Secondary | ICD-10-CM | POA: Insufficient documentation

## 2023-11-30 DIAGNOSIS — Z01818 Encounter for other preprocedural examination: Secondary | ICD-10-CM | POA: Insufficient documentation

## 2023-11-30 DIAGNOSIS — Z7982 Long term (current) use of aspirin: Secondary | ICD-10-CM | POA: Insufficient documentation

## 2023-11-30 DIAGNOSIS — I6522 Occlusion and stenosis of left carotid artery: Secondary | ICD-10-CM | POA: Insufficient documentation

## 2023-11-30 DIAGNOSIS — Z7902 Long term (current) use of antithrombotics/antiplatelets: Secondary | ICD-10-CM | POA: Diagnosis not present

## 2023-11-30 DIAGNOSIS — Z794 Long term (current) use of insulin: Secondary | ICD-10-CM | POA: Insufficient documentation

## 2023-11-30 DIAGNOSIS — Z8673 Personal history of transient ischemic attack (TIA), and cerebral infarction without residual deficits: Secondary | ICD-10-CM | POA: Insufficient documentation

## 2023-11-30 DIAGNOSIS — E119 Type 2 diabetes mellitus without complications: Secondary | ICD-10-CM | POA: Insufficient documentation

## 2023-11-30 LAB — CBC
HCT: 42.6 % (ref 39.0–52.0)
Hemoglobin: 14.5 g/dL (ref 13.0–17.0)
MCH: 30 pg (ref 26.0–34.0)
MCHC: 34 g/dL (ref 30.0–36.0)
MCV: 88.2 fL (ref 80.0–100.0)
Platelets: 248 K/uL (ref 150–400)
RBC: 4.83 MIL/uL (ref 4.22–5.81)
RDW: 12.7 % (ref 11.5–15.5)
WBC: 10.8 K/uL — ABNORMAL HIGH (ref 4.0–10.5)
nRBC: 0 % (ref 0.0–0.2)

## 2023-11-30 LAB — URINALYSIS, ROUTINE W REFLEX MICROSCOPIC
Bacteria, UA: NONE SEEN
Bilirubin Urine: NEGATIVE
Glucose, UA: >=500 mg/dL — AB
Hgb urine dipstick: NEGATIVE
Ketones, ur: NEGATIVE mg/dL
Leukocytes,Ua: NEGATIVE
Nitrite: NEGATIVE
Protein, ur: NEGATIVE mg/dL
Specific Gravity, Urine: 1.031 — ABNORMAL HIGH (ref 1.005–1.030)
pH: 5 (ref 5.0–8.0)

## 2023-11-30 LAB — COMPREHENSIVE METABOLIC PANEL WITH GFR
ALT: 33 U/L (ref 0–44)
AST: 24 U/L (ref 15–41)
Albumin: 4.7 g/dL (ref 3.5–5.0)
Alkaline Phosphatase: 31 U/L — ABNORMAL LOW (ref 38–126)
Anion gap: 12 (ref 5–15)
BUN: 15 mg/dL (ref 8–23)
CO2: 20 mmol/L — ABNORMAL LOW (ref 22–32)
Calcium: 9.2 mg/dL (ref 8.9–10.3)
Chloride: 106 mmol/L (ref 98–111)
Creatinine, Ser: 0.92 mg/dL (ref 0.61–1.24)
GFR, Estimated: >60 mL/min (ref >60)
Glucose, Bld: 92 mg/dL (ref 70–99)
Potassium: 4.1 mmol/L (ref 3.5–5.1)
Sodium: 138 mmol/L (ref 135–145)
Total Bilirubin: 0.8 mg/dL (ref 0.0–1.2)
Total Protein: 7.5 g/dL (ref 6.5–8.1)

## 2023-11-30 LAB — TYPE AND SCREEN
ABO/RH(D): A POS
Antibody Screen: NEGATIVE

## 2023-11-30 LAB — PROTIME-INR
INR: 1 (ref 0.8–1.2)
Prothrombin Time: 13.9 s (ref 11.4–15.2)

## 2023-11-30 LAB — APTT: aPTT: 28 s (ref 24–36)

## 2023-11-30 LAB — SURGICAL PCR SCREEN
MRSA, PCR: NEGATIVE
Staphylococcus aureus: NEGATIVE

## 2023-11-30 LAB — GLUCOSE, CAPILLARY: Glucose-Capillary: 139 mg/dL — ABNORMAL HIGH (ref 70–99)

## 2023-11-30 NOTE — Progress Notes (Signed)
 PCP - Dibas Koirala,MD Cardiologist - denies  PPM/ICD - denies Device Orders -  Rep Notified -   Chest x-ray - na EKG - 11/30/23 Stress Test - denies ECHO - 11/22/23 Cardiac Cath - denies  Sleep Study - denies CPAP -   Fasting Blood Sugar - 100-110 Checks Blood Sugar - has CGM  Last dose of GLP1 agonist-  na GLP1 instructions: na  Blood Thinner Instructions:per Dr Lanis murray Plavix . Aspirin  Instructions:per Dr. Lanis, continue Aspirin .  ERAS Protcol -NPO PRE-SURGERY Ensure or G2-   COVID TEST- NA   Anesthesia review: yes- abnormal EKG,HTN, DM2, Left ICA stenosis  Patient denies shortness of breath, fever, cough and chest pain at PAT appointment   All instructions explained to the patient, with a verbal understanding of the material. Patient agrees to go over the instructions while at home for a better understanding.The opportunity to ask questions was provided.

## 2023-11-30 NOTE — Pre-Procedure Instructions (Signed)
 Surgical Instructions   Your procedure is scheduled on Monday, December 1st.   Report to [facility] Main Entrance A at 0530 A.M., then check in with the Admitting office. Any questions or running late day of surgery: call [phone]  Questions prior to your surgery date: call [phone], Monday-Friday, 8am-4pm. If you experience any cold or flu symptoms such as cough, fever, chills, shortness of breath, etc. between now and your scheduled surgery, please notify us  at the above number.     Remember:  Do not eat or drink after midnight the night before your surgery  Take these medicines the morning of surgery with A SIP OF WATER:            amLODipine (NORVASC)             aspirin              atorvastatin (LIPITOR)             clopidogrel  (PLAVIX )             escitalopram (LEXAPRO)    One week prior to surgery, STOP taking any Aleve, Naproxen, Ibuprofen, Motrin, Advil, Goody's, BC's, all herbal medications, fish oil, and non-prescription vitamins.          WHAT DO I DO ABOUT MY DIABETES MEDICATION?  Please hold JARDIANCE for 72 hours before your surgery. Last dose should be on November 27.  Do not take oral diabetes medicines (pills) the morning of surgery. Last dose of metFromin should be on November 30.   THE NIGHT BEFORE SURGERY, take 8 units of LANTUS insulin.       HOW TO MANAGE YOUR DIABETES BEFORE AND AFTER SURGERY  Why is it important to control my blood sugar before and after surgery? Improving blood sugar levels before and after surgery helps healing and can limit problems. A way of improving blood sugar control is eating a healthy diet by:  Eating less sugar and carbohydrates  Increasing activity/exercise  Talking with your doctor about reaching your blood sugar goals High blood sugars (greater than 180 mg/dL) can raise your risk of infections and slow your recovery, so you will need to focus on controlling your diabetes during the weeks before surgery. Make  sure that the doctor who takes care of your diabetes knows about your planned surgery including the date and location.  How do I manage my blood sugar before surgery? Check your blood sugar at least 4 times a day, starting 2 days before surgery, to make sure that the level is not too high or low.  Check your blood sugar the morning of your surgery when you wake up and every 2 hours until you get to the Short Stay unit.  If your blood sugar is less than 70 mg/dL, you will need to treat for low blood sugar: Do not take insulin. Treat a low blood sugar (less than 70 mg/dL) with  cup of clear juice (cranberry or apple), 4 glucose tablets, OR glucose gel. Recheck blood sugar in 15 minutes after treatment (to make sure it is greater than 70 mg/dL). If your blood sugar is not greater than 70 mg/dL on recheck, call [phone] for further instructions. Report your blood sugar to the short stay nurse when you get to Short Stay.  If you are admitted to the hospital after surgery: Your blood sugar will be checked by the staff and you will probably be given insulin after surgery (instead of oral diabetes medicines) to make sure you  have good blood sugar levels. The goal for blood sugar control after surgery is 80-180 mg/dL.            Do NOT Smoke (Tobacco/Vaping) for 24 hours prior to your procedure.  If you use a CPAP at night, you may bring your mask/headgear for your overnight stay.   You will be asked to remove any contacts, glasses, piercing's, hearing aid's, dentures/partials prior to surgery. Please bring cases for these items if needed.    Patients discharged the day of surgery will not be allowed to drive home, and someone needs to stay with them for 24 hours.  SURGICAL WAITING ROOM VISITATION Patients may have no more than 2 support people in the waiting area - these visitors may rotate.   Pre-op nurse will coordinate an appropriate time for 1 ADULT support person, who may not rotate, to  accompany patient in pre-op.  Children under the age of 6 must have an adult with them who is not the patient and must remain in the main waiting area with an adult.  If the patient needs to stay at the hospital during part of their recovery, the visitor guidelines for inpatient rooms apply.  Please refer to the [facility] website for the visitor guidelines for any additional information.   If you received a COVID test during your pre-op visit  it is requested that you wear a mask when out in public, stay away from anyone that may not be feeling well and notify your surgeon if you develop symptoms. If you have been in contact with anyone that has tested positive in the last 10 days please notify you surgeon.      Pre-operative CHG Bathing Instructions   You can play a key role in reducing the risk of infection after surgery. Your skin needs to be as free of germs as possible. You can reduce the number of germs on your skin by washing with CHG (chlorhexidine gluconate) soap before surgery. CHG is an antiseptic soap that kills germs and continues to kill germs even after washing.   DO NOT use if you have an allergy to chlorhexidine/CHG or antibacterial soaps. If your skin becomes reddened or irritated, stop using the CHG and notify one of our RNs at [phone].              TAKE A SHOWER THE NIGHT BEFORE SURGERY   Please keep in mind the following:  DO NOT shave, including legs and underarms, 48 hours prior to surgery.   You may shave your face before/day of surgery.  Place clean sheets on your bed the night before surgery Use a clean washcloth (not used since being washed) for shower. DO NOT sleep with pet's night before surgery.  CHG Shower Instructions:  Wash your face and private area with normal soap. If you choose to wash your hair, wash first with your normal shampoo.  After you use shampoo/soap, rinse your hair and body thoroughly to remove shampoo/soap residue.  Turn the  water OFF and apply half the bottle of CHG soap to a CLEAN washcloth.  Apply CHG soap ONLY FROM YOUR NECK DOWN TO YOUR TOES (washing for 3-5 minutes)  DO NOT use CHG soap on face, private areas, open wounds, or sores.  Pay special attention to the area where your surgery is being performed.  If you are having back surgery, having someone wash your back for you may be helpful. Wait 2 minutes after CHG soap is applied, then  you may rinse off the CHG soap.  Pat dry with a clean towel  Put on clean pajamas    Additional instructions for the day of surgery: If you choose, you may shower the morning of surgery with an antibacterial soap.  DO NOT APPLY any lotions, deodorants, cologne, or perfumes.   Do not wear jewelry or makeup Do not wear nail polish, gel polish, artificial nails, or any other type of covering on natural nails (fingers and toes) Do not bring valuables to the hospital. [facility] is not responsible for valuables/personal belongings. Put on clean/comfortable clothes.  Please brush your teeth.  Ask your nurse before applying any prescription medications to the skin.

## 2023-11-30 NOTE — Progress Notes (Signed)
 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  He had evaluation but Dr. Lanis on 11/24/2023.  He was referred for symptomatic severe left ICA stenosis.  About 1 month prior patient had transient left eye blindness/amaurosis fugax.  His ophthalmologist could not find an ocular etiology.  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 Now scheduled for left TCAR. He was started on ASA and Plavix . Statin continued.   History includes never smoker, HTN, HLD, DM2, carotid artery stenosis (critical LICA stenosis & evidence of age indeterminate small left frontoparietal infarcts on 11/17/2023 CTA).  TTE on 11/22/2023 showed LVEF 60-65%, no regional wall motion abnormalities, moderate asymmetric LVH of the basal septal segment, grade 1 diastolic dysfunction, normal RV systolic function, normal PASP, estimated RVSP 31.1 mmHg, trivial TR/PR.  A1c 7.1% on 11/14/2023 [facility]).  Advised to hold Jardiance for 72 hours prior to surgery. He is also on Lantus and metformin and has a CGM.  He will continue aspirin  and Plavix  per Dr. Lanis.  Anesthesia team to evaluate on the day of surgery.    VS: BP 122/88   Pulse 84   Temp 36.7 C   Resp 19   Ht 5' 11 (1.803 m)   Wt 88.9 kg   SpO2 94%   BMI 27.34 kg/m    PROVIDERS: [place], [facility]   LABS: Labs reviewed: Acceptable for surgery. (all labs ordered are listed, but only abnormal results are displayed)  Labs Reviewed  GLUCOSE, CAPILLARY - Abnormal; Notable for the following components:      Result Value    Glucose-Capillary 139 (*)    All other components within normal limits  CBC - Abnormal; Notable for the following components:   WBC 10.8 (*)    All other components within normal limits  COMPREHENSIVE METABOLIC PANEL WITH GFR - Abnormal; Notable for the following components:   CO2 20 (*)    Alkaline Phosphatase 31 (*)    All other components within normal limits  URINALYSIS, ROUTINE W REFLEX MICROSCOPIC - Abnormal; Notable for the following components:   Color, Urine AMBER (*)    APPearance HAZY (*)    Specific Gravity, Urine 1.031 (*)    Glucose, UA >=500 (*)    All other components within normal limits  SURGICAL PCR SCREEN  PROTIME-INR  APTT  TYPE AND SCREEN   A1c 7.1% on 11/14/2023 ([facility]).   IMAGES: CTA Head/Neck 11/17/2023: IMPRESSION: 1. Critical proximal left internal carotid artery stenosis with diminished distal flow. 2. Small age indeterminate cortical/subcortical infarcts in the left frontoparietal region. 3. Mild atherosclerosis elsewhere in the head and neck without an additional significant stenosis.    EKG: EKG 11/30/2023: Sinus rhythm with occasional premature ventricular complexes Nonspecific T wave abnormality Abnormal ECG - Baseline wander in V1-3   CV: Echo 11/22/2023: IMPRESSIONS   1. Left ventricular ejection fraction, by estimation, is 60 to 65%. Left  ventricular ejection fraction by PLAX is 62 %. The left ventricle has  normal function. The left ventricle has no regional wall motion  abnormalities. There is moderate asymmetric  left ventricular hypertrophy of the basal-septal segment. Left ventricular  diastolic parameters are consistent with Grade I diastolic dysfunction  (impaired relaxation).   2. Right ventricular systolic function is normal. The right ventricular  size is normal. There is normal pulmonary artery systolic pressure. The  estimated right ventricular systolic pressure is 31.1 mmHg.   3. The mitral valve is normal in  structure. No evidence of mitral valve  regurgitation.   4. The aortic valve is tricuspid. Aortic valve regurgitation is not  visualized.   5. The inferior vena cava is normal in size with greater than 50%  respiratory variability, suggesting right atrial pressure of 3 mmHg.  - Comparison(s): No prior Echocardiogram.    US  Carotid 11/08/2023: Summary:  - Right Carotid: Velocities in the right ICA are consistent with a 1-39% stenosis.  - Left Carotid: Velocities in the left ICA are consistent with a 1-39% stenosis. Left ICA is not visualized well due to calcific plaque. Can consider f/u CTA of head/neck to further visualize  - Vertebrals:  Bilateral vertebral arteries demonstrate antegrade flow.  - Subclavians: Normal flow hemodynamics were seen in bilateral subclavian arteries.   Past Medical History:  Diagnosis Date   Carotid artery occlusion    Diabetes mellitus without complication (HCC)    Hyperlipidemia    Hypertension     Past Surgical History:  Procedure Laterality Date   HERNIA REPAIR Bilateral 1971    MEDICATIONS:  acetaminophen (TYLENOL) 650 MG suppository   amLODipine (NORVASC) 10 MG tablet   aspirin  EC 81 MG tablet   atorvastatin (LIPITOR) 80 MG tablet   Cholecalciferol (VITAMIN D3) 25 MCG CAPS   clopidogrel  (PLAVIX ) 75 MG tablet   Coenzyme Q10-Vitamin E (QUNOL ULTRA COQ10 PO)   escitalopram (LEXAPRO) 5 MG tablet   Ferrous Sulfate (IRON) 325 (65 Fe) MG TABS   fluorouracil (EFUDEX) 5 % cream   JARDIANCE 25 MG TABS tablet   LANTUS SOLOSTAR 100 UNIT/ML Solostar Pen   metFORMIN (GLUCOPHAGE) 1000 MG tablet   POTASSIUM CHLORIDE PO   valsartan (DIOVAN) 320 MG tablet   No current facility-administered medications for this encounter.    Isaiah Ruder, PA-C Surgical Short Stay/Anesthesiology [facility] Phone [phone] [facility] Phone [phone] 11/30/2023 1:31 PM

## 2023-11-30 NOTE — Anesthesia Preprocedure Evaluation (Addendum)
 Anesthesia Evaluation  Patient identified by MRN, date of birth, ID band Patient awake    Reviewed: Allergy & Precautions, NPO status , Patient's Chart, lab work & pertinent test results  Airway Mallampati: II  TM Distance: >3 FB Neck ROM: Full    Dental  (+) Teeth Intact, Dental Advisory Given   Pulmonary neg pulmonary ROS   Pulmonary exam normal breath sounds clear to auscultation       Cardiovascular hypertension, Pt. on medications + Peripheral Vascular Disease (L carotid stenosis)  Normal cardiovascular exam Rhythm:Regular Rate:Normal     Neuro/Psych negative neurological ROS  negative psych ROS   GI/Hepatic negative GI ROS, Neg liver ROS,,,  Endo/Other  diabetes, Type 2, Oral Hypoglycemic Agents    Renal/GU negative Renal ROS     Musculoskeletal negative musculoskeletal ROS (+)    Abdominal   Peds  Hematology  (+) Blood dyscrasia (Plavix )   Anesthesia Other Findings   Reproductive/Obstetrics                              Anesthesia Physical Anesthesia Plan  ASA: 3  Anesthesia Plan: General   Post-op Pain Management: Tylenol PO (pre-op)*   Induction: Intravenous  PONV Risk Score and Plan: 2 and Midazolam, Dexamethasone and Ondansetron  Airway Management Planned: Oral ETT  Additional Equipment: Arterial line  Intra-op Plan:   Post-operative Plan: Extubation in OR  Informed Consent: I have reviewed the patients History and Physical, chart, labs and discussed the procedure including the risks, benefits and alternatives for the proposed anesthesia with the patient or authorized representative who has indicated his/her understanding and acceptance.     Dental advisory given  Plan Discussed with: CRNA  Anesthesia Plan Comments: (PAT note written 11/30/2023 by Isaiah Ruder, PA-C.  2nd PIV )         Anesthesia Quick Evaluation

## 2023-12-02 LAB — HEMOGLOBIN A1C
Hgb A1c MFr Bld: 6.8 % — ABNORMAL HIGH (ref 4.8–5.6)
Mean Plasma Glucose: 148 mg/dL

## 2023-12-05 ENCOUNTER — Inpatient Hospital Stay (HOSPITAL_COMMUNITY): Payer: Self-pay | Admitting: Vascular Surgery

## 2023-12-05 ENCOUNTER — Encounter (HOSPITAL_COMMUNITY): Admission: RE | Disposition: A | Payer: Self-pay | Source: Home / Self Care | Attending: Vascular Surgery

## 2023-12-05 ENCOUNTER — Inpatient Hospital Stay (HOSPITAL_COMMUNITY)
Admission: RE | Admit: 2023-12-05 | Discharge: 2023-12-06 | DRG: 036 | Disposition: A | Discharge status: Home / Self Care | Attending: Vascular Surgery | Admitting: Vascular Surgery

## 2023-12-05 ENCOUNTER — Encounter (HOSPITAL_COMMUNITY): Payer: Self-pay | Admitting: Vascular Surgery

## 2023-12-05 ENCOUNTER — Inpatient Hospital Stay (HOSPITAL_COMMUNITY): Payer: Self-pay | Admitting: Anesthesiology

## 2023-12-05 ENCOUNTER — Inpatient Hospital Stay (HOSPITAL_COMMUNITY)

## 2023-12-05 ENCOUNTER — Other Ambulatory Visit: Payer: Self-pay

## 2023-12-05 DIAGNOSIS — I10 Essential (primary) hypertension: Secondary | ICD-10-CM | POA: Diagnosis present

## 2023-12-05 DIAGNOSIS — Z7984 Long term (current) use of oral hypoglycemic drugs: Secondary | ICD-10-CM | POA: Diagnosis not present

## 2023-12-05 DIAGNOSIS — Z79899 Other long term (current) drug therapy: Secondary | ICD-10-CM

## 2023-12-05 DIAGNOSIS — E785 Hyperlipidemia, unspecified: Secondary | ICD-10-CM | POA: Diagnosis present

## 2023-12-05 DIAGNOSIS — Z7902 Long term (current) use of antithrombotics/antiplatelets: Secondary | ICD-10-CM | POA: Diagnosis not present

## 2023-12-05 DIAGNOSIS — Z888 Allergy status to other drugs, medicaments and biological substances status: Secondary | ICD-10-CM

## 2023-12-05 DIAGNOSIS — Z7982 Long term (current) use of aspirin: Secondary | ICD-10-CM

## 2023-12-05 DIAGNOSIS — E119 Type 2 diabetes mellitus without complications: Secondary | ICD-10-CM

## 2023-12-05 DIAGNOSIS — Z01818 Encounter for other preprocedural examination: Secondary | ICD-10-CM

## 2023-12-05 DIAGNOSIS — I6522 Occlusion and stenosis of left carotid artery: Principal | ICD-10-CM | POA: Diagnosis present

## 2023-12-05 DIAGNOSIS — Z794 Long term (current) use of insulin: Secondary | ICD-10-CM

## 2023-12-05 HISTORY — PX: TRANSCAROTID ARTERY REVASCULARIZATIONÂ: SHX6778

## 2023-12-05 HISTORY — PX: ULTRASOUND GUIDANCE FOR VASCULAR ACCESS: SHX6516

## 2023-12-05 LAB — CREATININE, SERUM
Creatinine, Ser: 0.94 mg/dL (ref 0.61–1.24)
GFR, Estimated: >60 mL/min (ref >60)

## 2023-12-05 LAB — GLUCOSE, CAPILLARY
Glucose-Capillary: 172 mg/dL — ABNORMAL HIGH (ref 70–99)
Glucose-Capillary: 127 mg/dL — ABNORMAL HIGH (ref 70–99)
Glucose-Capillary: 135 mg/dL — ABNORMAL HIGH (ref 70–99)
Glucose-Capillary: 145 mg/dL — ABNORMAL HIGH (ref 70–99)
Glucose-Capillary: 209 mg/dL — ABNORMAL HIGH (ref 70–99)
Glucose-Capillary: 199 mg/dL — ABNORMAL HIGH (ref 70–99)

## 2023-12-05 LAB — CBC
HCT: 36.6 % — ABNORMAL LOW (ref 39.0–52.0)
Hemoglobin: 12.6 g/dL — ABNORMAL LOW (ref 13.0–17.0)
MCH: 30.3 pg (ref 26.0–34.0)
MCHC: 34.4 g/dL (ref 30.0–36.0)
MCV: 88 fL (ref 80.0–100.0)
Platelets: 208 K/uL (ref 150–400)
RBC: 4.16 MIL/uL — ABNORMAL LOW (ref 4.22–5.81)
RDW: 13.1 % (ref 11.5–15.5)
WBC: 9.8 K/uL (ref 4.0–10.5)
nRBC: 0 % (ref 0.0–0.2)

## 2023-12-05 LAB — ABO/RH: ABO/RH(D): A POS

## 2023-12-05 SURGERY — TRANSCAROTID ARTERY REVASCULARIZATION (TCAR)
Anesthesia: General | Site: Neck | Laterality: Right

## 2023-12-05 MED ORDER — LABETALOL HCL 5 MG/ML IV SOLN: 10.0000 mg | INTRAVENOUS | Status: DC | PRN

## 2023-12-05 MED ORDER — INSULIN ASPART 100 UNIT/ML IJ SOLN
0.0000 [IU] | Freq: Three times a day (TID) | INTRAMUSCULAR | Status: DC
  Administered 2023-12-05: 2 [IU] via SUBCUTANEOUS
  Administered 2023-12-05: 5 [IU] via SUBCUTANEOUS
  Filled 2023-12-05: qty 5
  Filled 2023-12-05: qty 2

## 2023-12-05 MED ORDER — CHLORHEXIDINE GLUCONATE 0.12 % MT SOLN
15.0000 mL | Freq: Once | OROMUCOSAL | Status: AC
  Administered 2023-12-05: 15 mL via OROMUCOSAL
  Filled 2023-12-05: qty 15

## 2023-12-05 MED ORDER — HEPARIN SODIUM (PORCINE) 1000 UNIT/ML IJ SOLN
INTRAMUSCULAR | Status: DC | PRN
  Administered 2023-12-05: 9000 [IU] via INTRAVENOUS

## 2023-12-05 MED ORDER — PROPOFOL 10 MG/ML IV BOLUS
INTRAVENOUS | Status: AC
  Filled 2023-12-05: qty 20

## 2023-12-05 MED ORDER — SODIUM CHLORIDE 0.9 % IV SOLN: 500.0000 mL | Freq: Once | INTRAVENOUS | Status: DC | PRN

## 2023-12-05 MED ORDER — MORPHINE SULFATE (PF) 2 MG/ML IV SOLN: 2.0000 mg | INTRAVENOUS | Status: DC | PRN

## 2023-12-05 MED ORDER — HEPARIN 6000 UNIT IRRIGATION SOLUTION
Status: AC
  Filled 2023-12-05: qty 500

## 2023-12-05 MED ORDER — ETOMIDATE 2 MG/ML IV SOLN
INTRAVENOUS | Status: AC
  Filled 2023-12-05: qty 10

## 2023-12-05 MED ORDER — ACETAMINOPHEN 650 MG RE SUPP: 325.0000 mg | RECTAL | Status: DC | PRN

## 2023-12-05 MED ORDER — CHLORHEXIDINE GLUCONATE CLOTH 2 % EX PADS: 6.0000 | MEDICATED_PAD | Freq: Once | CUTANEOUS | Status: DC

## 2023-12-05 MED ORDER — HYDRALAZINE HCL 20 MG/ML IJ SOLN: 5.0000 mg | INTRAMUSCULAR | Status: DC | PRN

## 2023-12-05 MED ORDER — EPHEDRINE 5 MG/ML INJ
INTRAVENOUS | Status: AC
  Filled 2023-12-05: qty 5

## 2023-12-05 MED ORDER — POTASSIUM CHLORIDE CRYS ER 20 MEQ PO TBCR: 40.0000 meq | EXTENDED_RELEASE_TABLET | Freq: Every day | ORAL | Status: DC | PRN

## 2023-12-05 MED ORDER — CEFAZOLIN SODIUM-DEXTROSE 2-4 GM/100ML-% IV SOLN
2.0000 g | Freq: Three times a day (TID) | INTRAVENOUS | Status: AC
  Administered 2023-12-05 – 2023-12-06 (×2): 2 g via INTRAVENOUS
  Filled 2023-12-05 (×2): qty 100

## 2023-12-05 MED ORDER — HEPARIN 6000 UNIT IRRIGATION SOLUTION
Status: DC | PRN
  Administered 2023-12-05: 1

## 2023-12-05 MED ORDER — HEMOSTATIC AGENTS (NO CHARGE) OPTIME
TOPICAL | Status: DC | PRN
  Administered 2023-12-05 (×2): 1 via TOPICAL

## 2023-12-05 MED ORDER — LIDOCAINE 2% (20 MG/ML) 5 ML SYRINGE
INTRAMUSCULAR | Status: DC | PRN
  Administered 2023-12-05: 80 mg via INTRAVENOUS

## 2023-12-05 MED ORDER — EPHEDRINE SULFATE-NACL 50-0.9 MG/10ML-% IV SOSY
PREFILLED_SYRINGE | INTRAVENOUS | Status: DC | PRN
  Administered 2023-12-05 (×2): 2.5 mg via INTRAVENOUS
  Administered 2023-12-05: 10 mg via INTRAVENOUS
  Administered 2023-12-05: 2.5 mg via INTRAVENOUS
  Administered 2023-12-05 (×3): 7.5 mg via INTRAVENOUS

## 2023-12-05 MED ORDER — IRBESARTAN 150 MG PO TABS
150.0000 mg | ORAL_TABLET | Freq: Every day | ORAL | Status: DC
  Administered 2023-12-05 – 2023-12-06 (×2): 150 mg via ORAL
  Filled 2023-12-05 (×2): qty 1

## 2023-12-05 MED ORDER — FENTANYL CITRATE (PF) 250 MCG/5ML IJ SOLN
INTRAMUSCULAR | Status: AC
  Filled 2023-12-05: qty 5

## 2023-12-05 MED ORDER — PROTAMINE SULFATE 10 MG/ML IV SOLN
INTRAVENOUS | Status: AC
  Filled 2023-12-05: qty 5

## 2023-12-05 MED ORDER — VASOPRESSIN 20 UNIT/ML IV SOLN
INTRAVENOUS | Status: AC
  Filled 2023-12-05: qty 1

## 2023-12-05 MED ORDER — PROPOFOL 10 MG/ML IV BOLUS
INTRAVENOUS | Status: DC | PRN
  Administered 2023-12-05: 130 mg via INTRAVENOUS

## 2023-12-05 MED ORDER — PHENYLEPHRINE 80 MCG/ML (10ML) SYRINGE FOR IV PUSH (FOR BLOOD PRESSURE SUPPORT)
PREFILLED_SYRINGE | INTRAVENOUS | Status: AC
  Filled 2023-12-05: qty 10

## 2023-12-05 MED ORDER — POLYETHYLENE GLYCOL 3350 17 G PO PACK: 17.0000 g | PACK | Freq: Every day | ORAL | Status: DC | PRN

## 2023-12-05 MED ORDER — ATORVASTATIN CALCIUM 80 MG PO TABS
80.0000 mg | ORAL_TABLET | Freq: Every day | ORAL | Status: DC
  Administered 2023-12-05 – 2023-12-06 (×2): 80 mg via ORAL
  Filled 2023-12-05 (×2): qty 1

## 2023-12-05 MED ORDER — EMPAGLIFLOZIN 25 MG PO TABS
25.0000 mg | ORAL_TABLET | Freq: Every day | ORAL | Status: DC
  Administered 2023-12-06: 25 mg via ORAL
  Filled 2023-12-05: qty 1

## 2023-12-05 MED ORDER — INSULIN ASPART 100 UNIT/ML IJ SOLN
INTRAMUSCULAR | Status: AC
  Administered 2023-12-05: 2 [IU] via SUBCUTANEOUS
  Filled 2023-12-05: qty 2

## 2023-12-05 MED ORDER — METFORMIN HCL 500 MG PO TABS
1000.0000 mg | ORAL_TABLET | Freq: Two times a day (BID) | ORAL | Status: DC
  Administered 2023-12-06: 1000 mg via ORAL
  Filled 2023-12-05: qty 2

## 2023-12-05 MED ORDER — ACETAMINOPHEN 325 MG PO TABS
325.0000 mg | ORAL_TABLET | ORAL | Status: DC | PRN
  Administered 2023-12-05 – 2023-12-06 (×2): 650 mg via ORAL
  Filled 2023-12-05 (×2): qty 2

## 2023-12-05 MED ORDER — INSULIN ASPART 100 UNIT/ML IJ SOLN: 0.0000 [IU] | INTRAMUSCULAR | Status: DC | PRN

## 2023-12-05 MED ORDER — FENTANYL CITRATE (PF) 250 MCG/5ML IJ SOLN
INTRAMUSCULAR | Status: DC | PRN
  Administered 2023-12-05: 100 ug via INTRAVENOUS
  Administered 2023-12-05: 75 ug via INTRAVENOUS

## 2023-12-05 MED ORDER — ONDANSETRON HCL 4 MG/2ML IJ SOLN: 4.0000 mg | Freq: Once | INTRAMUSCULAR | Status: DC | PRN

## 2023-12-05 MED ORDER — 0.9 % SODIUM CHLORIDE (POUR BTL) OPTIME
TOPICAL | Status: DC | PRN
  Administered 2023-12-05: 1000 mL

## 2023-12-05 MED ORDER — ASPIRIN 81 MG PO TBEC
81.0000 mg | DELAYED_RELEASE_TABLET | Freq: Every day | ORAL | Status: DC
  Administered 2023-12-06: 81 mg via ORAL
  Filled 2023-12-05: qty 1

## 2023-12-05 MED ORDER — ONDANSETRON HCL 4 MG/2ML IJ SOLN
INTRAMUSCULAR | Status: AC
  Filled 2023-12-05: qty 2

## 2023-12-05 MED ORDER — SODIUM CHLORIDE 0.9 % IV SOLN: INTRAVENOUS | Status: DC

## 2023-12-05 MED ORDER — SODIUM CHLORIDE 0.9 % IV SOLN: INTRAVENOUS | Status: AC

## 2023-12-05 MED ORDER — LIDOCAINE HCL (PF) 1 % IJ SOLN
INTRAMUSCULAR | Status: AC
  Filled 2023-12-05: qty 5

## 2023-12-05 MED ORDER — OXYCODONE HCL 5 MG PO TABS
5.0000 mg | ORAL_TABLET | ORAL | Status: DC | PRN
  Administered 2023-12-06: 5 mg via ORAL
  Filled 2023-12-05: qty 1

## 2023-12-05 MED ORDER — SODIUM CHLORIDE 0.9 % IV SOLN
0.0125 ug/kg/min | INTRAVENOUS | Status: AC
  Administered 2023-12-05: .125 ug/kg/min via INTRAVENOUS
  Filled 2023-12-05: qty 2000

## 2023-12-05 MED ORDER — DEXAMETHASONE SOD PHOSPHATE PF 10 MG/ML IJ SOLN
INTRAMUSCULAR | Status: DC | PRN
  Administered 2023-12-05: 4 mg via INTRAVENOUS

## 2023-12-05 MED ORDER — PROTAMINE SULFATE 10 MG/ML IV SOLN
INTRAVENOUS | Status: DC | PRN
  Administered 2023-12-05: 50 mg via INTRAVENOUS

## 2023-12-05 MED ORDER — ROCURONIUM BROMIDE 10 MG/ML (PF) SYRINGE
PREFILLED_SYRINGE | INTRAVENOUS | Status: DC | PRN
  Administered 2023-12-05: 10 mg via INTRAVENOUS
  Administered 2023-12-05: 60 mg via INTRAVENOUS

## 2023-12-05 MED ORDER — PHENYLEPHRINE HCL-NACL 20-0.9 MG/250ML-% IV SOLN
INTRAVENOUS | Status: DC | PRN
  Administered 2023-12-05: 40 ug/min via INTRAVENOUS

## 2023-12-05 MED ORDER — ONDANSETRON HCL 4 MG/2ML IJ SOLN: 4.0000 mg | Freq: Four times a day (QID) | INTRAMUSCULAR | Status: DC | PRN

## 2023-12-05 MED ORDER — ACETAMINOPHEN 500 MG PO TABS
1000.0000 mg | ORAL_TABLET | Freq: Once | ORAL | Status: AC
  Administered 2023-12-05: 1000 mg via ORAL
  Filled 2023-12-05: qty 2

## 2023-12-05 MED ORDER — AMLODIPINE BESYLATE 10 MG PO TABS
10.0000 mg | ORAL_TABLET | Freq: Every day | ORAL | Status: DC
  Administered 2023-12-06: 10 mg via ORAL
  Filled 2023-12-05: qty 1

## 2023-12-05 MED ORDER — FERROUS SULFATE 325 (65 FE) MG PO TABS
325.0000 mg | ORAL_TABLET | Freq: Every day | ORAL | Status: DC
  Administered 2023-12-05: 325 mg via ORAL
  Filled 2023-12-05: qty 1

## 2023-12-05 MED ORDER — MIDAZOLAM HCL 2 MG/2ML IJ SOLN
INTRAMUSCULAR | Status: AC
  Filled 2023-12-05: qty 2

## 2023-12-05 MED ORDER — CEFAZOLIN SODIUM-DEXTROSE 2-4 GM/100ML-% IV SOLN
2.0000 g | INTRAVENOUS | Status: AC
  Administered 2023-12-05: 2 g via INTRAVENOUS
  Filled 2023-12-05: qty 100

## 2023-12-05 MED ORDER — SUGAMMADEX SODIUM 200 MG/2ML IV SOLN
INTRAVENOUS | Status: DC | PRN
  Administered 2023-12-05: 200 mg via INTRAVENOUS

## 2023-12-05 MED ORDER — PHENYLEPHRINE 80 MCG/ML (10ML) SYRINGE FOR IV PUSH (FOR BLOOD PRESSURE SUPPORT)
PREFILLED_SYRINGE | INTRAVENOUS | Status: DC | PRN
  Administered 2023-12-05: 80 ug via INTRAVENOUS
  Administered 2023-12-05 (×3): 40 ug via INTRAVENOUS
  Administered 2023-12-05: 80 ug via INTRAVENOUS
  Administered 2023-12-05 (×2): 160 ug via INTRAVENOUS
  Administered 2023-12-05: 40 ug via INTRAVENOUS

## 2023-12-05 MED ORDER — MIDAZOLAM HCL (PF) 2 MG/2ML IJ SOLN
INTRAMUSCULAR | Status: DC | PRN
  Administered 2023-12-05: 2 mg via INTRAVENOUS

## 2023-12-05 MED ORDER — BISACODYL 5 MG PO TBEC: 5.0000 mg | DELAYED_RELEASE_TABLET | Freq: Every day | ORAL | Status: DC | PRN

## 2023-12-05 MED ORDER — INSULIN GLARGINE-YFGN 100 UNIT/ML ~~LOC~~ SOLN
16.0000 [IU] | Freq: Every day | SUBCUTANEOUS | Status: DC
  Administered 2023-12-05 – 2023-12-06 (×2): 16 [IU] via SUBCUTANEOUS
  Filled 2023-12-05 (×2): qty 0.16

## 2023-12-05 MED ORDER — ONDANSETRON HCL 4 MG/2ML IJ SOLN
INTRAMUSCULAR | Status: DC | PRN
  Administered 2023-12-05: 4 mg via INTRAVENOUS

## 2023-12-05 MED ORDER — ORAL CARE MOUTH RINSE: 15.0000 mL | Freq: Once | OROMUCOSAL | Status: AC

## 2023-12-05 MED ORDER — FENTANYL CITRATE (PF) 100 MCG/2ML IJ SOLN: 25.0000 ug | INTRAMUSCULAR | Status: DC | PRN

## 2023-12-05 MED ORDER — LACTATED RINGERS IV SOLN: INTRAVENOUS | Status: DC

## 2023-12-05 MED ORDER — IODIXANOL 320 MG/ML IV SOLN
INTRAVENOUS | Status: DC | PRN
  Administered 2023-12-05: 28 mL via INTRA_ARTERIAL

## 2023-12-05 MED ORDER — PHENOL 1.4 % MT LIQD: 1.0000 | OROMUCOSAL | Status: DC | PRN

## 2023-12-05 MED ORDER — GLYCOPYRROLATE PF 0.2 MG/ML IJ SOSY
PREFILLED_SYRINGE | INTRAMUSCULAR | Status: DC | PRN
  Administered 2023-12-05: .2 mg via INTRAVENOUS

## 2023-12-05 MED ORDER — HEPARIN SODIUM (PORCINE) 5000 UNIT/ML IJ SOLN
5000.0000 [IU] | Freq: Three times a day (TID) | INTRAMUSCULAR | Status: DC
  Administered 2023-12-06: 5000 [IU] via SUBCUTANEOUS
  Filled 2023-12-05: qty 1

## 2023-12-05 MED ORDER — ROCURONIUM BROMIDE 10 MG/ML (PF) SYRINGE
PREFILLED_SYRINGE | INTRAVENOUS | Status: AC
  Filled 2023-12-05: qty 10

## 2023-12-05 MED ORDER — ESCITALOPRAM OXALATE 10 MG PO TABS
5.0000 mg | ORAL_TABLET | Freq: Every day | ORAL | Status: DC
  Administered 2023-12-05 – 2023-12-06 (×2): 5 mg via ORAL
  Filled 2023-12-05 (×2): qty 1

## 2023-12-05 MED ORDER — METOPROLOL TARTRATE 5 MG/5ML IV SOLN: 2.5000 mg | INTRAVENOUS | Status: DC | PRN

## 2023-12-05 MED ORDER — CLOPIDOGREL BISULFATE 75 MG PO TABS
75.0000 mg | ORAL_TABLET | Freq: Every day | ORAL | Status: DC
  Administered 2023-12-06: 75 mg via ORAL
  Filled 2023-12-05: qty 1

## 2023-12-05 MED ORDER — LIDOCAINE 2% (20 MG/ML) 5 ML SYRINGE
INTRAMUSCULAR | Status: AC
  Filled 2023-12-05: qty 5

## 2023-12-05 MED ORDER — DOCUSATE SODIUM 100 MG PO CAPS
100.0000 mg | ORAL_CAPSULE | Freq: Every day | ORAL | Status: DC
  Administered 2023-12-06: 100 mg via ORAL
  Filled 2023-12-05: qty 1

## 2023-12-05 SURGICAL SUPPLY — 43 items
BAG BANDED W/RUBBER/TAPE 36X54 (MISCELLANEOUS) ×2 IMPLANT
BAG COUNTER SPONGE SURGICOUNT (BAG) ×2 IMPLANT
CANISTER SUCTION 3000ML PPV (SUCTIONS) ×2 IMPLANT
CATH BALLN ENROUTE 6X35 (CATHETERS) IMPLANT
CATH ROBINSON RED A/P 18FR (CATHETERS) IMPLANT
CLIP TI MEDIUM 6 (CLIP) ×4 IMPLANT
CLIP TI WIDE RED SMALL 6 (CLIP) ×4 IMPLANT
COVER DOME SNAP 22 D (MISCELLANEOUS) ×2 IMPLANT
COVER PROBE W GEL 5X96 (DRAPES) ×2 IMPLANT
DERMABOND ADVANCED .7 DNX12 (GAUZE/BANDAGES/DRESSINGS) ×2 IMPLANT
DRAPE C-ARM 42X72 X-RAY (DRAPES) IMPLANT
DRAPE FEMORAL ANGIO 80X135IN (DRAPES) ×2 IMPLANT
ELECTRODE REM PT RTRN 9FT ADLT (ELECTROSURGICAL) ×2 IMPLANT
GOWN STRL REUS W/ TWL LRG LVL3 (GOWN DISPOSABLE) ×4 IMPLANT
GOWN STRL REUS W/TWL 2XL LVL3 (GOWN DISPOSABLE) ×4 IMPLANT
GUIDEWIRE ENROUTE 0.014 (WIRE) ×2 IMPLANT
HEMOSTAT SNOW SURGICEL 2X4 (HEMOSTASIS) IMPLANT
KIT BASIN OR (CUSTOM PROCEDURE TRAY) ×2 IMPLANT
KIT ENCORE 26 ADVANTAGE (KITS) ×2 IMPLANT
KIT INTRODUCER GALT 7 (INTRODUCER) ×2 IMPLANT
KIT TURNOVER KIT B (KITS) ×2 IMPLANT
LOOP VESSEL MINI RED (MISCELLANEOUS) IMPLANT
NDL HYPO 25GX1X1/2 BEV (NEEDLE) IMPLANT
NEEDLE HYPO 25GX1X1/2 BEV (NEEDLE) IMPLANT
PACK CAROTID (CUSTOM PROCEDURE TRAY) ×2 IMPLANT
POSITIONER HEAD DONUT 9IN (MISCELLANEOUS) ×2 IMPLANT
SET MICROPUNCTURE 5F STIFF (MISCELLANEOUS) ×2 IMPLANT
SOLN STERILE WATER BTL 1000 ML (IV SOLUTION) ×2 IMPLANT
STATION PROTECTION PRESSURIZED (MISCELLANEOUS) ×2 IMPLANT
STENT TRANSCAROTID SYS 10X40 (Permanent Stent) IMPLANT
SUT MNCRL AB 4-0 PS2 18 (SUTURE) ×2 IMPLANT
SUT PROLENE 5 0 C 1 24 (SUTURE) ×2 IMPLANT
SUT SILK 2 0 PERMA HAND 18 BK (SUTURE) IMPLANT
SUT SILK 2 0 SH (SUTURE) ×2 IMPLANT
SUT SILK 3-0 18XBRD TIE 12 (SUTURE) IMPLANT
SUT VIC AB 3-0 SH 27X BRD (SUTURE) ×2 IMPLANT
SYR 10ML LL (SYRINGE) ×6 IMPLANT
SYR 20ML LL LF (SYRINGE) ×2 IMPLANT
SYR CONTROL 10ML LL (SYRINGE) IMPLANT
SYSTEM ENROUTE TCAR NEURO PLUS (FILTER) IMPLANT
TOWEL GREEN STERILE (TOWEL DISPOSABLE) ×2 IMPLANT
WIRE BENTSON .035X145CM (WIRE) ×2 IMPLANT
WIRE TORQFLEX AUST .018X40CM (WIRE) IMPLANT

## 2023-12-05 NOTE — H&P (Signed)
 Office Note   Patient seen and examined in preop holding.  No complaints. No changes to medication history or physical exam since last seen in clinic. After discussing the risks and benefits of left transcarotid artery revascularization, Jimmy Callahan elected to proceed. He is aware CEA would come with more risk due to the lesion location.  Jimmy FORBES Rim MD   CC: Symptomatic left ICA stenosis Requesting Provider:  No ref. provider found  HPI: Jimmy Callahan is a 68 y.o. (1955-06-15) male presenting at the request of .Koirala, Dibas, MD for evaluation of symptomatic left ICA stenosis.  Roughly 1 month ago, Jimmy Callahan had acute blindness of the left eye.  He was seen by his ophthalmologist who could not find an ocular etiology.  He recommended assessing the carotid arteries.  String sign was appreciated in the left internal carotid artery.  Jimmy Callahan presents my office today with symptomatic left ICA stenosis.  On exam, Jimmy Callahan was doing well.  He had no complaints.  Fortunately left eye blindness was transient.  He has had no further episodes.  Denies symptoms of TIA, stroke, amaurosis otherwise.  Occasions include statin. He is a non-smoker  Past Medical History:  Diagnosis Date   Carotid artery occlusion    Diabetes mellitus without complication (HCC)    Hyperlipidemia    Hypertension     Past Surgical History:  Procedure Laterality Date   HERNIA REPAIR Bilateral 1971    Social History   Socioeconomic History   Marital status: Single    Spouse name: Not on file   Number of children: Not on file   Years of education: Not on file   Highest education level: Not on file  Occupational History   Not on file  Tobacco Use   Smoking status: Never   Smokeless tobacco: Never  Vaping Use   Vaping status: Never Used  Substance and Sexual Activity   Alcohol use: No   Drug use: Not Currently   Sexual activity: Not on file  Other Topics Concern   Not on file  Social  History Narrative   Not on file   Social Drivers of Health   Financial Resource Strain: Not on file  Food Insecurity: Not on file  Transportation Needs: Not on file  Physical Activity: Not on file  Stress: Not on file  Social Connections: Not on file  Intimate Partner Violence: Not on file  History reviewed. No pertinent family history.  Current Facility-Administered Medications  Medication Dose Route Frequency Provider Last Rate Last Admin   0.9 %  sodium chloride infusion   Intravenous Continuous Naliah Eddington E, MD       ceFAZolin (ANCEF) IVPB 2g/100 mL premix  2 g Intravenous 30 min Pre-Op Carlia Bomkamp E, MD       Chlorhexidine Gluconate Cloth 2 % PADS 6 each  6 each Topical Once Lisandro Meggett E, MD       And   Chlorhexidine Gluconate Cloth 2 % PADS 6 each  6 each Topical Once Hilding Quintanar E, MD       insulin aspart (novoLOG) injection 0-14 Units  0-14 Units Subcutaneous Q2H PRN Corinne Garnette FORBES, MD   2 Units at 12/05/23 0635   lactated ringers infusion   Intravenous Continuous Lucious Debby FORBES, MD        Allergies  Allergen Reactions   Paroxetine Hcl     Other Reaction(s): nausea,lightheaded   Telmisartan-Hctz     Other Reaction(s): orthostatic/lightheadedness     REVIEW  OF SYSTEMS:  [X]  denotes positive finding, [ ]  denotes negative finding Cardiac  Comments:  Chest pain or chest pressure:    Shortness of breath upon exertion:    Short of breath when lying flat:    Irregular heart rhythm:        Vascular    Pain in calf, thigh, or hip brought on by ambulation:    Pain in feet at night that wakes you up from your sleep:     Blood clot in your veins:    Leg swelling:         Pulmonary    Oxygen at home:    Productive cough:     Wheezing:         Neurologic    Sudden weakness in arms or legs:     Sudden numbness in arms or legs:     Sudden onset of difficulty speaking or slurred speech:    Temporary loss of vision in one eye:     Problems with  dizziness:         Gastrointestinal    Blood in stool:     Vomited blood:         Genitourinary    Burning when urinating:     Blood in urine:        Psychiatric    Major depression:         Hematologic    Bleeding problems:    Problems with blood clotting too easily:        Skin    Rashes or ulcers:        Constitutional    Fever or chills:      PHYSICAL EXAMINATION:  Vitals:   12/05/23 0607  BP: (!) 140/88  Pulse: 100  Resp: 20  Temp: 98.9 F (37.2 C)  TempSrc: Oral  SpO2: 95%  Weight: 88.9 kg  Height: 5' 9 (1.753 m)    General:  WDWN in NAD; vital signs documented above Gait: Not observed HENT: WNL, normocephalic Pulmonary: normal non-labored breathing , without wheezing Cardiac: regular HR Abdomen: soft, NT, no masses Skin: without rashes Vascular Exam/Pulses:  Right Left  Radial 2+ (normal) 2+ (normal)                       Extremities: without ischemic changes, without Gangrene , without cellulitis; without open wounds;  Musculoskeletal: no muscle wasting or atrophy  Neurologic: A&O X 3;  No focal weakness or paresthesias are detected Psychiatric:  The pt has Normal affect.   Non-Invasive Vascular Imaging:   Near occlusion of the left internal carotid artery.  There is appear to be flow lumen.  Some level of stenosis greater than 99%.    ASSESSMENT/PLAN: Jimmy Callahan is a 68 y.o. male presenting with symptomatic left ICA stenosis.  I had a long discussion with Jimmy Callahan regarding the above.  He is aware that over the next 2 years, with medical management, he has a 26% risk of stroke.  With intervention, this is decreased to 9%.    We discussed both carotid endarterectomy and TCAR.  He would like to have the lesion treated.  After discussing both and using shared decision making, Jimmy Callahan elected to proceed with transcarotid artery revascularization.  Will schedule in the coming weeks.  I have ordered both aspirin  and Plavix  for him to  begin taking on a daily basis.  I asked that he continue his high intensity statin.   Jimmy FORBES Rim,  MD [facility] [phone]

## 2023-12-05 NOTE — Op Note (Addendum)
 NAME: Jimmy Callahan    MRN: [id number] DOB: 07-29-1955    DATE OF OPERATION: 12/05/2023  PREOP DIAGNOSIS:    Symptomatic left internal carotid artery stenosis  POSTOP DIAGNOSIS:    Same  PROCEDURE:    Left transcarotid artery revascularization with 10 x 40 mm stent  SURGEON: Fonda FORBES Rim  ASSIST: Sherrilee Holster, PA  ANESTHESIA: General  EBL: 25 mL  INDICATIONS:    Jimmy Callahan is a 68 y.o. male with prior history of amaurosis and imaging demonstrating greater than 95% stenosis.  Lesion was very high.  Nonamenable to carotid endarterectomy.  After discussing risks and benefits of transcarotid artery revascularization, Jimmy Callahan elected to proceed.  FINDINGS:   Long, 3.5 cm tapering lesion in the internal carotid artery with multiple areas of greater than 95% stenosis  TECHNIQUE:   The patient was brought to the operating room, where support lines were placed and general anesthesia was secured. The left neck and left groin were prepped and the patient was sterilely draped. A longitudinal 2-4 cm incision was made between the sternal and clavicular heads of the sternocleidomastoid muscle, below the omohyoid. Following longitudinal division of the carotid sheath the jugular vein was partially dissected and retracted medially. Once 3 cm of common carotid artery (CCA) were isolated, umbilical tape was placed around the proximal 1/3 of the CCA under direct vision. A 5.0 polypropylene suture was pre-placed in the anterior wall of the CCA, in a "U stitch" configuration, close to the clavicle to facilitate hemostasis upon removal of the arterial sheath at completion of the TCAR procedure.  The contralateral (left) common femoral vein (CFV) was accessed under ultrasound guidance, using standard Seldinger and micropuncture access technique. Permanent recorded image(s) was/were saved in the patient's medical record. The Venous Return Sheath was advanced into the CFV over the 0.035"  wire provided. Blood was aspirated from the flow line followed by flushing of the Venous Sheath with heparinized saline. The Venous Sheath was secured to the patient's skin with suture to maintain optimal position in the vessel.  Heparin was given to obtain a therapeutic activated clotting time >250 seconds prior to arterial access. A 4-French non-stiffened ENHANCE Transcarotid / Peripheral Access set was used, puncturing the artery with the 21G needle through the pre-placed "U" stitch while holding gentle traction on the umbilical tape to stabilize and centralize the CCA within the incision. Careful attention was paid to the change in CCA shape when using the umbilical tape to control or lift the artery. The micropuncture wire was then advanced 3-4 cm into the CCA and, the 21G needle was removed. The micropuncture sheath was advanced 2-3 cm into the CCA and the wire and dilator were removed. Pulsatile backflow indicated correct positioning. The provided 0.035 J-tipped guidewire was inserted as close as possible to the bifurcation without engaging the lesion. After micropuncture sheath removal, the Transcarotid Arterial Sheath was advanced to the 2.5cm marker and the 0.035" wire and dilator were then removed. Arterial Sheath position was assessed under fluoroscopy in two projections to ensure that the sheath tip was oriented coaxially in the CCA. The Arterial Sheath was sutured to the patient with gentle forward tension. Blood was slowly aspirated followed by flushing with heparinized saline. No ingress of air bubbles through the passive hemostatic valve was observed. The stopcocks were closed. Traction applied to the CCA previously to facilitate access was gently released.  The Flow Controller was connected to the Transcarotid Arterial Sheath, prepared by passively allowing a column  of arterial blood to fill the line and connected to the Venous Return Sheath. CCA inflow was occluded proximal to the  arteriotomy with a vascular clamp to achieve active flow reversal. To confirm flow reversal, a saline bolus was delivered into the venous flow line on both "High" and "Low" flow settings of the Flow Controller. Angiograms were performed with slow injections of a small amount of contrast filling just past the lesion to minimize antegrade transmission of micro-bubbles.  Prior to lesion manipulation, heart rate (70bpm) and systolic BP (140-179mmHg) were managed upwards to optimize flow reversal and procedural neuroprotection. The lesion was crossed with an 0.014" ENROUTE guidewire and pre-dilation of the lesion was performed with a 6mm x 30mm rapid exchange 0.014" compatible balloon catheter to 8 atmospheres for 10 seconds. Stenting was performed with an 10mm x 40mm ENROUTE Transcarotid stent, sized appropriately to the right CCA. AP and lateral angiograms (gentle contrast injections) were performed to confirm stent placement and arterial wall stent apposition.  The stent deployed from the mid internal carotid to the proximal portion of the internal carotid artery. Prior imaging was reviewed.  There was no proximal disease in the common carotid artery, therefore I elected not to place another stent and extend down into the common carotid artery.  I was very happy with this result.  At [employer] case completion, antegrade flow was restored by releasing the clamp on the CCA then closing the NPS stopcocks to the flow lines. The Transcarotid Arterial Sheath was removed and the pre-closure suture was tied. Heparin reversal was employed.  The wound bed was irrigated with copious amounts of saline.  Thrombin infused material was placed in the wound bed (sno).  Wound was closed in layers using 3-0 Vicryl suture with Monocryl and Dermabond to the level of the skin.  The Venous Return Sheath was removed and hemostasis was achieved with brief manual compression.  The patient tolerated the procedure well and was extubated on  the table. The patient was moving all four extremities to command prior to transfer to the recovery room.   Fonda FORBES Rim, MD [facility] DATE OF DICTATION:   12/05/2023

## 2023-12-05 NOTE — Anesthesia Postprocedure Evaluation (Signed)
 Anesthesia Post Note  Patient: Jimmy Callahan  Procedure(s) Performed: LEFT TRANSCAROTID ARTERY REVASCULARIZATION (Left: Neck) ULTRASOUND GUIDANCE, FOR VASCULAR ACCESS (Right: Groin)     Patient location during evaluation: PACU Anesthesia Type: General Level of consciousness: awake and alert Pain management: pain level controlled Vital Signs Assessment: post-procedure vital signs reviewed and stable Respiratory status: spontaneous breathing, nonlabored ventilation, respiratory function stable and patient connected to nasal cannula oxygen Cardiovascular status: blood pressure returned to baseline and stable Postop Assessment: no apparent nausea or vomiting Anesthetic complications: no   No notable events documented.  Last Vitals:  Vitals:   12/05/23 1401 12/05/23 1600  BP: 107/60 (!) 112/58  Pulse:  83  Resp:  18  Temp:  36.6 C  SpO2:  93%    Last Pain:  Vitals:   12/05/23 1600  TempSrc: Oral  PainSc:                  Garnette FORBES [place]

## 2023-12-05 NOTE — Transfer of Care (Signed)
 Immediate Anesthesia Transfer of Care Note  Patient: Jimmy Callahan  Procedure(s) Performed: LEFT TRANSCAROTID ARTERY REVASCULARIZATION (Left: Neck) ULTRASOUND GUIDANCE, FOR VASCULAR ACCESS (Right: Groin)  Patient Location: PACU  Anesthesia Type:General  Level of Consciousness: awake and alert   Airway & Oxygen Therapy: Patient Spontanous Breathing  Post-op Assessment: Report given to RN and Post -op Vital signs reviewed and stable  Post vital signs: Reviewed and stable  Last Vitals:  Vitals Value Taken Time  BP 105/63 12/05/23 09:45  Temp 36.7 C 12/05/23 09:42  Pulse 86 12/05/23 09:46  Resp 12 12/05/23 09:46  SpO2 95 % 12/05/23 09:46  Vitals shown include unfiled device data.  Last Pain:  Vitals:   12/05/23 0628  TempSrc:   PainSc: 0-No pain      Patients Stated Pain Goal: 0 (12/05/23 9371)  Complications: No notable events documented.

## 2023-12-05 NOTE — Anesthesia Procedure Notes (Signed)
 Procedure Name: Intubation Date/Time: 12/05/2023 7:43 AM  Performed by: Roslynn Waddell LABOR, CRNAPre-anesthesia Checklist: Patient identified, Emergency Drugs available, Suction available and Patient being monitored Patient Re-evaluated:Patient Re-evaluated prior to induction Oxygen Delivery Method: Circle System Utilized Preoxygenation: Pre-oxygenation with 100% oxygen Induction Type: IV induction Ventilation: Mask ventilation without difficulty Grade View: Grade I Tube type: Oral Tube size: 7.5 mm Number of attempts: 1 Airway Equipment and Method: Stylet and Oral airway Placement Confirmation: ETT inserted through vocal cords under direct vision, positive ETCO2 and breath sounds checked- equal and bilateral Secured at: 23 cm Tube secured with: Tape Dental Injury: Teeth and Oropharynx as per pre-operative assessment  Comments: Atraumatic induction/intubation. Dentition and oral mucosa as per preop.

## 2023-12-05 NOTE — Anesthesia Procedure Notes (Signed)
 Arterial Line Insertion Start/End12/01/2023 7:04 AM, 12/05/2023 7:14 AM Performed by: Corinne Garnette BRAVO, MD, Roslynn Waddell LABOR, CRNA, CRNA  Patient location: OR. Preanesthetic checklist: patient identified, IV checked, site marked, risks and benefits discussed, surgical consent, monitors and equipment checked, pre-op evaluation, timeout performed and anesthesia consent Lidocaine 1% used for infiltration Right, radial was placed Catheter size: 20 G Maximum sterile barriers used  and Seldinger technique used  Following insertion, Biopatch. Post procedure assessment: normal  Patient tolerated the procedure well with no immediate complications.

## 2023-12-06 ENCOUNTER — Encounter (HOSPITAL_COMMUNITY): Payer: Self-pay | Admitting: Vascular Surgery

## 2023-12-06 DIAGNOSIS — I6522 Occlusion and stenosis of left carotid artery: Secondary | ICD-10-CM

## 2023-12-06 DIAGNOSIS — Z48812 Encounter for surgical aftercare following surgery on the circulatory system: Secondary | ICD-10-CM

## 2023-12-06 LAB — CBC
HCT: 35.2 % — ABNORMAL LOW (ref 39.0–52.0)
Hemoglobin: 11.9 g/dL — ABNORMAL LOW (ref 13.0–17.0)
MCH: 29.9 pg (ref 26.0–34.0)
MCHC: 33.8 g/dL (ref 30.0–36.0)
MCV: 88.4 fL (ref 80.0–100.0)
Platelets: 197 K/uL (ref 150–400)
RBC: 3.98 MIL/uL — ABNORMAL LOW (ref 4.22–5.81)
RDW: 13.1 % (ref 11.5–15.5)
WBC: 13.2 K/uL — ABNORMAL HIGH (ref 4.0–10.5)
nRBC: 0 % (ref 0.0–0.2)

## 2023-12-06 LAB — LIPID PANEL
Cholesterol: 73 mg/dL (ref 0–200)
HDL: 25 mg/dL — ABNORMAL LOW (ref >40)
LDL Cholesterol: 31 mg/dL (ref 0–99)
Total CHOL/HDL Ratio: 2.9 ratio
Triglycerides: 85 mg/dL (ref <150)
VLDL: 17 mg/dL (ref 0–40)

## 2023-12-06 LAB — BASIC METABOLIC PANEL WITH GFR
Anion gap: 8 (ref 5–15)
BUN: 14 mg/dL (ref 8–23)
CO2: 25 mmol/L (ref 22–32)
Calcium: 8.5 mg/dL — ABNORMAL LOW (ref 8.9–10.3)
Chloride: 107 mmol/L (ref 98–111)
Creatinine, Ser: 0.88 mg/dL (ref 0.61–1.24)
GFR, Estimated: >60 mL/min (ref >60)
Glucose, Bld: 126 mg/dL — ABNORMAL HIGH (ref 70–99)
Potassium: 3.9 mmol/L (ref 3.5–5.1)
Sodium: 140 mmol/L (ref 135–145)

## 2023-12-06 LAB — POCT ACTIVATED CLOTTING TIME: Activated Clotting Time: 256 s

## 2023-12-06 LAB — GLUCOSE, CAPILLARY
Glucose-Capillary: 144 mg/dL — ABNORMAL HIGH (ref 70–99)
Glucose-Capillary: 133 mg/dL — ABNORMAL HIGH (ref 70–99)

## 2023-12-06 NOTE — Plan of Care (Signed)

## 2023-12-06 NOTE — Progress Notes (Addendum)
 Progress Note    12/06/2023 7:08 AM 1 Day Post-Op  Subjective:  Mr. Nouri is a well-appearing 68 year old male with no complaints. He greeted me upon entering the room and was in no acute distress.    Vitals:   12/05/23 2352 12/06/23 0338  BP: 114/71 102/63  Pulse: 66 70  Resp:    Temp: 98.3 F (36.8 C) 98.8 F (37.1 C)  SpO2: 93% 98%   Physical Exam: Cardiac:  RRR, 78 Sinus Rhythm in the room.  Lungs:  Non-labored Incisions:  Clean, dry, and intact. No hematoma.  Extremities: Purposeful movement in all extremities. Strength is 5/5. Pulses are 2+, bilaterally at radials and Dps.  Abdomen:  Soft, non-tender. Neurologic: Alert and oriented. Normal affect. Vision is stable.   CBC    Component Value Date/Time   WBC 13.2 (H) 12/06/2023 0634   RBC 3.98 (L) 12/06/2023 0634   HGB 11.9 (L) 12/06/2023 0634   HCT 35.2 (L) 12/06/2023 0634   PLT 197 12/06/2023 0634   MCV 88.4 12/06/2023 0634   MCH 29.9 12/06/2023 0634   MCHC 33.8 12/06/2023 0634   RDW 13.1 12/06/2023 0634   LYMPHSABS 1.9 02/04/2014 0927   MONOABS 0.7 02/04/2014 0927   EOSABS 0.2 02/04/2014 0927   BASOSABS 0.1 02/04/2014 0927    BMET    Component Value Date/Time   NA 140 12/06/2023 0634   K 3.9 12/06/2023 0634   CL 107 12/06/2023 0634   CO2 25 12/06/2023 0634   GLUCOSE 126 (H) 12/06/2023 0634   BUN 14 12/06/2023 0634   CREATININE 0.88 12/06/2023 0634   CALCIUM 8.5 (L) 12/06/2023 0634   GFRNONAA >60 12/06/2023 0634   GFRAA 85 (L) 02/04/2014 0927    INR    Component Value Date/Time   INR 1.0 11/30/2023 1200     Intake/Output Summary (Last 24 hours) at 12/06/2023 0708 Last data filed at 12/06/2023 9661 Gross per 24 hour  Intake 1867.31 ml  Output 1555 ml  Net 312.31 ml     Assessment/Plan:  68 y.o. male is s/p left transcarotid artery revascularization with 10x40mm stent by Dr. Lanis.  1 Day Post-Op   Mr. Torregrossa is a well-appearing 68 year old male with no complaints. He is POD#1  of left TCAR due to symptomatic left ICA stenosis resulting in amaurosis. He is doing excellent this morning. He has no new neurological signs to suggest a stroke. He is drinking, eating, walking, and talking with out any trouble. His incision site is clean and dry without evidence of hematoma or ecchymosis. His BMP is stable. His CBC suggest minimal blood loss + dilution IVF and a stress leukocytosis. I think he is clinically outstanding POD#1 and can go home today after discussion with Dr. Lanis.  BMP: Cr is 0.88 and stable. Lytes are normal. CBC: WBC of 13.2, likely stress leukocytosis. He is afebrile. Hemoglobin is 11.9, down from 12.6. Likely combination of minimal blood loss and dilution for IVF.  Neuro: Intact. Able to articulate words and sentences appropriately. Alert and oriented to person, place, time, and event. Moves all extremities with purpose. Denies any issues with gait. He is eating and drinking without any issues swallowing.   Plan Continue ASA, plavix , and statin  Continue to take all prescribed medications by other providers Maintain a healthy lifestyle through diet and exercise  Regularly see PCP Follow up in the [facility] office as scheduled Discharge today, after discussion with Dr. Lanis Lytle Pizza, PA-S  [facility]  [phone] 12/06/2023 7:08 AM  VASCULAR STAFF ADDENDUM: I have independently interviewed and examined the patient. I agree with the above.  Overall doing well.  No sensorimotor deficits. Left sided neck incision is healing appropriately. Plan for home today.  Fonda FORBES Rim MD [facility] Phone Number: [phone] 12/06/2023 9:16 AM

## 2023-12-06 NOTE — Discharge Instructions (Signed)
   [facility]  Discharge Instructions   Carotid Surgery  Please refer to the following instructions for your post-procedure care. Your surgeon or physician assistant will discuss any changes with you.  Activity  You are encouraged to walk as much as you can. You can slowly return to normal activities but must avoid strenuous activity and heavy lifting until your doctor tell you it's okay. Avoid activities such as vacuuming or swinging a golf club. You can drive after one week if you are comfortable and you are no longer taking prescription pain medications. It is normal to feel tired for serval weeks after your surgery. It is also normal to have difficulty with sleep habits, eating, and bowel movements after surgery. These will go away with time.  Bathing/Showering  Shower daily after you go home. Do not soak in a bathtub, hot tub, or swim until the incision heals completely.  Incision Care  Shower every day. Clean your incision with mild soap and water. Pat the area dry with a clean towel. You do not need a bandage unless otherwise instructed. Do not apply any ointments or creams to your incision. You may have skin glue on your incision. Do not peel it off. It will come off on its own in about one week. Your incision may feel thickened and raised for several weeks after your surgery. This is normal and the skin will soften over time.   For Men Only: It's okay to shave around the incision but do not shave the incision itself for 2 weeks. It is common to have numbness under your chin that could last for several months.  Diet  Resume your normal diet. There are no special food restrictions following this procedure. A low fat/low cholesterol diet is recommended for all patients with vascular disease. In order to heal from your surgery, it is CRITICAL to get adequate nutrition. Your body requires vitamins, minerals, and protein. Vegetables are the best source of  vitamins and minerals. Vegetables also provide the perfect balance of protein. Processed food has little nutritional value, so try to avoid this.  Medications  Resume taking all of your medications unless your doctor or physician assistant tells you not to. If your incision is causing pain, you may take over-the- counter pain relievers such as acetaminophen  (Tylenol ). If you were prescribed a stronger pain medication, please be aware these medications can cause nausea and constipation. Prevent nausea by taking the medication with a snack or meal. Avoid constipation by drinking plenty of fluids and eating foods with a high amount of fiber, such as fruits, vegetables, and grains.  Do not take Tylenol  if you are taking prescription pain medications.  Follow Up  Our office will schedule a follow up appointment 4 weeks following discharge.  Please call us  immediately for any of the following conditions  Increased pain, redness, drainage (pus) from your incision site. Fever of 101 degrees or higher. If you should develop stroke (slurred speech, difficulty swallowing, weakness on one side of your body, loss of vision) you should call 911 and go to the nearest emergency room.  Reduce your risk of vascular disease:  Stop smoking. If you would like help call [facility] at 1-800-QUIT-NOW (9896827296) or [place] at (316)515-6878. Manage your cholesterol Maintain a desired weight Control your diabetes Keep your blood pressure down  If you have any questions, please call the office at [phone].

## 2023-12-06 NOTE — TOC Transition Note (Signed)
 Transition of Care (TOC) - Discharge Note Rayfield Gobble RN, BSN Inpatient Care Management Unit 4E- RN Case Manager See Treatment Team for direct phone #   Patient Details  Name: Jimmy Callahan MRN: [id number] Date of Birth: 1955/07/26  Transition of Care [facility]) CM/SW Contact:  Gobble Rayfield Hurst, RN Phone Number: 12/06/2023, 11:47 AM   Clinical Narrative:    Pt stable for transition home today, CM received notice from [facility] liaison that [facility] office made pre-op referral for [facility] needs.   CM spoke with pt at bedside- sister and SO also present. Pt up independently in room, dressed and ready for discharge.  Discussed with pt HH referral- pt voiced he does not feel he will need HH follow up and declines.  Sister and SO to transport home and assist as needed.   Pt declines any discharge needs, will follow up as per AVS instructions and call [facility] office with any concerns.   No IP CM needs noted, [facility] liaison updated - no HH needs.    Final next level of care: Home/Self Care Barriers to Discharge: No Barriers Identified   Patient Goals and CMS Choice Patient states their goals for this hospitalization and ongoing recovery are:: return home   Choice offered to / list presented to : Patient      Discharge Placement               Home         Discharge Plan and Services Additional resources added to the After Visit Summary for   In-house Referral: NA Discharge Planning Services: CM Consult Post Acute Care Choice: Home Health          DME Arranged: N/A DME Agency: NA       HH Arranged: NA HH Agency: [facility] Date HH Agency Contacted: 12/06/23 Time HH Agency Contacted: 1147 Representative spoke with at [facility] Agency: [place] ([facility] referral)  Social Drivers of Health (SDOH) Interventions SDOH Screenings   Food Insecurity: No Food Insecurity (12/05/2023)  Housing: Low Risk  (12/05/2023)  Transportation Needs: No Transportation Needs  (12/05/2023)  Utilities: Not At Risk (12/05/2023)  Social Connections: Moderately Isolated (12/05/2023)  Tobacco Use: Low Risk  (12/05/2023)     Readmission Risk Interventions    12/06/2023   11:47 AM  Readmission Risk Prevention Plan  Post Dischage Appt Complete  Medication Screening Complete  Transportation Screening Complete

## 2023-12-06 NOTE — Discharge Summary (Signed)
 Discharge Summary     Jimmy Callahan 1955/07/03 68 y.o. 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  The pt tolerated the procedure well and was transported to the PACU in excellent condition.   By POD 1, the pt neuro status was intact. His incision was well appearing and dry. He was tolerating a normal diet, ambulating at baseline, and voiding without difficulty. He was hemodynamically stable.   He was discharged home on POD1.   Recent Labs    12/06/23 0634  NA 140  K 3.9  CL 107  CO2 25  GLUCOSE 126*  BUN 14  CALCIUM 8.5*   Recent Labs    12/05/23 1046 12/06/23 0634  WBC 9.8 13.2*  HGB 12.6* 11.9*  HCT 36.6* 35.2*  PLT 208 197   No results for input(s): INR in the last 72 hours.   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] Carotid stenosis, left [I65.22] Symptomatic carotid artery stenosis, left [I65.22]  Secondary Diagnosis: Patient Active Problem List   Diagnosis Date  Noted   Carotid stenosis, left 12/05/2023   Symptomatic carotid artery stenosis, left 12/05/2023   Past Medical History:  Diagnosis Date   Carotid artery occlusion    Diabetes mellitus without complication (HCC)    Hyperlipidemia    Hypertension     Allergies as of 12/06/2023       Reactions   Paroxetine Hcl    Other Reaction(s): nausea,lightheaded   Telmisartan-hctz    Other Reaction(s): orthostatic/lightheadedness        Medication List     TAKE these medications    acetaminophen 650 MG suppository Commonly known as: TYLENOL Place 650 mg rectally every 4 (four) hours as needed for mild pain (pain score 1-3) or moderate pain (pain score 4-6).   amLODipine 10 MG tablet Commonly known as: NORVASC Take 10 mg by mouth daily.   aspirin  EC 81 MG tablet Take 1 tablet (81 mg total) by mouth daily. Swallow whole.   atorvastatin 80 MG tablet Commonly known as: LIPITOR Take 80 mg by mouth daily.   clopidogrel  75 MG tablet Commonly known as: PLAVIX  Take 1 tablet (75 mg total) by mouth daily.   escitalopram 5 MG tablet Commonly known as: LEXAPRO Take 5 mg by mouth daily.   fluorouracil 5 % cream Commonly known as: EFUDEX Apply 1 Application topically daily as needed.   Iron 325 (65 Fe) MG Tabs  Take 1 tablet by mouth daily at 12 noon.   Jardiance  25 MG Tabs tablet Generic drug: empagliflozin  Take 25 mg by mouth daily.   Lantus  SoloStar 100 UNIT/ML Solostar Pen Generic drug: insulin  glargine Inject 16 Units into the skin daily.   metFORMIN  1000 MG tablet Commonly known as: GLUCOPHAGE  Take 1,000 mg by mouth 2 (two) times daily with a meal.   POTASSIUM CHLORIDE  PO Take 3 tablets by mouth daily. 99mg    QUNOL ULTRA COQ10 PO Take 1 tablet by mouth daily.   valsartan 320 MG tablet Commonly known as: DIOVAN Take 320 mg by mouth daily.   Vitamin D3 25 MCG Caps Take 25 mcg by mouth daily at 12 noon.               Discharge Care Instructions  (From  admission, onward)           Start     Ordered   12/06/23 0000  Discharge wound care:       Comments: Wash incision daily with antibacterial soap and warm water, then pat dry   12/06/23 1056             Discharge Instructions:   [facility] Discharge Instructions Carotid Revascularization  Please refer to the following instructions for your post-procedure care. Your surgeon or physician assistant will discuss any changes with you.  Activity  You are encouraged to walk as much as you can. You can slowly return to normal activities but must avoid strenuous activity and heavy lifting until your doctor tell you it's OK. Avoid activities such as vacuuming or swinging a golf club. You can drive after one week if you are comfortable and you are no longer taking prescription pain medications. It is normal to feel tired for serval weeks after your surgery. It is also normal to have difficulty with sleep habits, eating, and bowel movements after surgery. These will go away with time.  Bathing/Showering  You may shower after you come home. Do not soak in a bathtub, hot tub, or swim until the incision heals completely.  Incision Care  Shower every day. Clean your incision with mild soap and water. Pat the area dry with a clean towel. You do not need a bandage unless otherwise instructed. Do not apply any ointments or creams to your incision. You may have skin glue on your incision. Do not peel it off. It will come off on its own in about one week. Your incision may feel thickened and raised for several weeks after your surgery. This is normal and the skin will soften over time. For Men Only: It's OK to shave around the incision but do not shave the incision itself for 2 weeks. It is common to have numbness under your chin that could last for several months.  Diet  Resume your normal diet. There are no special food restrictions following this procedure. A  low fat/low cholesterol diet is recommended for all patients with vascular disease. In order to heal from your surgery, it is CRITICAL to get adequate nutrition. Your body requires vitamins, minerals, and protein. Vegetables are the best source of vitamins and minerals. Vegetables also provide the perfect balance of protein. Processed food has little nutritional value, so try to avoid this.  Medications  Resume taking all of your medications unless your doctor or physician assistant tells you not to.  If your incision is causing pain, you may take over-the- counter pain relievers such as  acetaminophen (Tylenol). If you were prescribed a stronger pain medication, please be aware these medications can cause nausea and constipation.  Prevent nausea by taking the medication with a snack or meal. Avoid constipation by drinking plenty of fluids and eating foods with a high amount of fiber, such as fruits, vegetables, and grains. Do not take Tylenol if you are taking prescription pain medications.  Follow Up  Our office will schedule a follow up appointment 2-3 weeks following discharge.  Please call us  immediately for any of the following conditions  Increased pain, redness, drainage (pus) from your incision site. Fever of 101 degrees or higher. If you should develop stroke (slurred speech, difficulty swallowing, weakness on one side of your body, loss of vision) you should call 911 and go to the nearest emergency room.  Reduce your risk of vascular disease:  Stop smoking. If you would like help call [facility] at 1-800-QUIT-NOW (334-432-6747) or [place] at [phone]. Manage your cholesterol Maintain a desired weight Control your diabetes Keep your blood pressure down  If you have any questions, please call the office at [phone].  Disposition: Home  Patient's condition: is Excellent  Follow up: 1. [facility] in 4 weeks.   Ahmed Holster, PA-C [facility] [phone]   --- For [facility] use ---   Modified Rankin score at D/C (0-6): 0  IV medication needed for:  1. Hypertension: No 2. Hypotension: No  Post-op Complications: No  1. Post-op CVA or TIA: No  If yes: Event classification (right eye, left eye, right cortical, left cortical, verterobasilar, other):   If yes: Timing of event (intra-op, <6 hrs post-op, >=6 hrs post-op, unknown):   2. CN injury: No  If yes: CN  injuried   3. Myocardial infarction: No  If yes: Dx by (EKG or clinical, Troponin):   4.  CHF: No  5.  Dysrhythmia (new): No  6. Wound infection: No  7. Reperfusion symptoms: No  8. Return to OR: No  If yes: return to OR for (bleeding, neurologic, other CEA incision, other):   Discharge medications: Statin use:  Yes ASA use:  Yes   Beta blocker use:  No ACE-Inhibitor use:  No  ARB use:  Yes CCB use: Yes P2Y12 Antagonist use: Yes, [ x] Plavix , [ ]  Plasugrel, [ ]  Ticlopinine, [ ]  Ticagrelor, [ ]  Other, [ ]  No for medical reason, [ ]  Non-compliant, [ ]  Not-indicated Anti-coagulant use:  No, [ ]  Warfarin, [ ]  Rivaroxaban, [ ]  Dabigatran,

## 2023-12-07 ENCOUNTER — Encounter: Admitting: Vascular Surgery

## 2023-12-09 ENCOUNTER — Other Ambulatory Visit (HOSPITAL_COMMUNITY): Payer: Self-pay

## 2023-12-09 ENCOUNTER — Other Ambulatory Visit: Payer: Self-pay

## 2023-12-09 DIAGNOSIS — I6522 Occlusion and stenosis of left carotid artery: Secondary | ICD-10-CM

## 2023-12-12 ENCOUNTER — Other Ambulatory Visit (HOSPITAL_COMMUNITY): Payer: Self-pay

## 2023-12-13 ENCOUNTER — Other Ambulatory Visit (HOSPITAL_COMMUNITY): Payer: Self-pay

## 2023-12-22 ENCOUNTER — Other Ambulatory Visit (HOSPITAL_COMMUNITY): Payer: Self-pay

## 2023-12-25 ENCOUNTER — Other Ambulatory Visit: Payer: Self-pay

## 2023-12-25 ENCOUNTER — Observation Stay (HOSPITAL_COMMUNITY)

## 2023-12-25 ENCOUNTER — Inpatient Hospital Stay (HOSPITAL_COMMUNITY)
Admission: EM | Admit: 2023-12-25 | Discharge: 2023-12-27 | DRG: 065 | Disposition: A | Discharge status: Home Health | Attending: Student in an Organized Health Care Education/Training Program | Admitting: Student in an Organized Health Care Education/Training Program

## 2023-12-25 ENCOUNTER — Emergency Department (HOSPITAL_COMMUNITY)

## 2023-12-25 ENCOUNTER — Encounter (HOSPITAL_COMMUNITY): Payer: Self-pay | Admitting: Emergency Medicine

## 2023-12-25 DIAGNOSIS — F419 Anxiety disorder, unspecified: Secondary | ICD-10-CM | POA: Diagnosis present

## 2023-12-25 DIAGNOSIS — I639 Cerebral infarction, unspecified: Secondary | ICD-10-CM | POA: Diagnosis not present

## 2023-12-25 DIAGNOSIS — E7800 Pure hypercholesterolemia, unspecified: Secondary | ICD-10-CM | POA: Diagnosis present

## 2023-12-25 DIAGNOSIS — R29810 Facial weakness: Secondary | ICD-10-CM | POA: Diagnosis present

## 2023-12-25 DIAGNOSIS — E119 Type 2 diabetes mellitus without complications: Secondary | ICD-10-CM | POA: Diagnosis present

## 2023-12-25 DIAGNOSIS — Z95828 Presence of other vascular implants and grafts: Secondary | ICD-10-CM

## 2023-12-25 DIAGNOSIS — I10 Essential (primary) hypertension: Secondary | ICD-10-CM | POA: Diagnosis present

## 2023-12-25 DIAGNOSIS — E1169 Type 2 diabetes mellitus with other specified complication: Secondary | ICD-10-CM | POA: Diagnosis present

## 2023-12-25 DIAGNOSIS — Z794 Long term (current) use of insulin: Secondary | ICD-10-CM | POA: Diagnosis not present

## 2023-12-25 DIAGNOSIS — R4701 Aphasia: Secondary | ICD-10-CM | POA: Diagnosis not present

## 2023-12-25 DIAGNOSIS — R531 Weakness: Principal | ICD-10-CM

## 2023-12-25 DIAGNOSIS — Z7984 Long term (current) use of oral hypoglycemic drugs: Secondary | ICD-10-CM

## 2023-12-25 DIAGNOSIS — D72829 Elevated white blood cell count, unspecified: Secondary | ICD-10-CM | POA: Diagnosis present

## 2023-12-25 DIAGNOSIS — R29703 NIHSS score 3: Secondary | ICD-10-CM | POA: Diagnosis present

## 2023-12-25 DIAGNOSIS — I63232 Cerebral infarction due to unspecified occlusion or stenosis of left carotid arteries: Principal | ICD-10-CM | POA: Diagnosis present

## 2023-12-25 DIAGNOSIS — E785 Hyperlipidemia, unspecified: Secondary | ICD-10-CM | POA: Diagnosis not present

## 2023-12-25 DIAGNOSIS — Z7982 Long term (current) use of aspirin: Secondary | ICD-10-CM

## 2023-12-25 DIAGNOSIS — G8191 Hemiplegia, unspecified affecting right dominant side: Secondary | ICD-10-CM | POA: Diagnosis present

## 2023-12-25 DIAGNOSIS — Z7902 Long term (current) use of antithrombotics/antiplatelets: Secondary | ICD-10-CM

## 2023-12-25 DIAGNOSIS — I6522 Occlusion and stenosis of left carotid artery: Secondary | ICD-10-CM | POA: Diagnosis not present

## 2023-12-25 LAB — I-STAT CHEM 8, ED
BUN: 18 mg/dL (ref 8–23)
Calcium, Ion: 1.18 mmol/L (ref 1.15–1.40)
Chloride: 105 mmol/L (ref 98–111)
Creatinine, Ser: 0.9 mg/dL (ref 0.61–1.24)
Glucose, Bld: 139 mg/dL — ABNORMAL HIGH (ref 70–99)
HCT: 43 % (ref 39.0–52.0)
Hemoglobin: 14.6 g/dL (ref 13.0–17.0)
Potassium: 4.1 mmol/L (ref 3.5–5.1)
Sodium: 139 mmol/L (ref 135–145)
TCO2: 21 mmol/L — ABNORMAL LOW (ref 22–32)

## 2023-12-25 LAB — COMPREHENSIVE METABOLIC PANEL WITH GFR
ALT: 47 U/L — ABNORMAL HIGH (ref 0–44)
AST: 28 U/L (ref 15–41)
Albumin: 4.7 g/dL (ref 3.5–5.0)
Alkaline Phosphatase: 51 U/L (ref 38–126)
Anion gap: 15 (ref 5–15)
BUN: 17 mg/dL (ref 8–23)
CO2: 20 mmol/L — ABNORMAL LOW (ref 22–32)
Calcium: 9.7 mg/dL (ref 8.9–10.3)
Chloride: 102 mmol/L (ref 98–111)
Creatinine, Ser: 0.83 mg/dL (ref 0.61–1.24)
GFR, Estimated: >60 mL/min
Glucose, Bld: 133 mg/dL — ABNORMAL HIGH (ref 70–99)
Potassium: 4.1 mmol/L (ref 3.5–5.1)
Sodium: 136 mmol/L (ref 135–145)
Total Bilirubin: 0.3 mg/dL (ref 0.0–1.2)
Total Protein: 7.6 g/dL (ref 6.5–8.1)

## 2023-12-25 LAB — URINE DRUG SCREEN
Amphetamines: NEGATIVE
Barbiturates: NEGATIVE
Benzodiazepines: NEGATIVE
Cocaine: NEGATIVE
Fentanyl: NEGATIVE
Methadone Scn, Ur: NEGATIVE
Opiates: NEGATIVE
Tetrahydrocannabinol: NEGATIVE

## 2023-12-25 LAB — URINALYSIS, ROUTINE W REFLEX MICROSCOPIC
Bilirubin Urine: NEGATIVE
Glucose, UA: >=500 mg/dL — AB
Hgb urine dipstick: NEGATIVE
Ketones, ur: NEGATIVE mg/dL
Leukocytes,Ua: NEGATIVE
Nitrite: NEGATIVE
Protein, ur: NEGATIVE mg/dL
Specific Gravity, Urine: <1.005 — ABNORMAL LOW (ref 1.005–1.030)
pH: 5.5 (ref 5.0–8.0)

## 2023-12-25 LAB — DIFFERENTIAL
Abs Immature Granulocytes: 0.04 K/uL (ref 0.00–0.07)
Basophils Absolute: 0.1 K/uL (ref 0.0–0.1)
Basophils Relative: 1 %
Eosinophils Absolute: 0.1 K/uL (ref 0.0–0.5)
Eosinophils Relative: 1 %
Immature Granulocytes: 0 %
Lymphocytes Relative: 15 %
Lymphs Abs: 1.7 K/uL (ref 0.7–4.0)
Monocytes Absolute: 0.9 K/uL (ref 0.1–1.0)
Monocytes Relative: 7 %
Neutro Abs: 8.8 K/uL — ABNORMAL HIGH (ref 1.7–7.7)
Neutrophils Relative %: 76 %

## 2023-12-25 LAB — CBC
HCT: 41.9 % (ref 39.0–52.0)
Hemoglobin: 14.3 g/dL (ref 13.0–17.0)
MCH: 30.2 pg (ref 26.0–34.0)
MCHC: 34.1 g/dL (ref 30.0–36.0)
MCV: 88.4 fL (ref 80.0–100.0)
Platelets: 289 K/uL (ref 150–400)
RBC: 4.74 MIL/uL (ref 4.22–5.81)
RDW: 13.2 % (ref 11.5–15.5)
WBC: 11.5 K/uL — ABNORMAL HIGH (ref 4.0–10.5)
nRBC: 0 % (ref 0.0–0.2)

## 2023-12-25 LAB — PLATELET INHIBITION P2Y12: Platelet Function  P2Y12: 28 [PRU] — ABNORMAL LOW (ref 182–335)

## 2023-12-25 LAB — URINALYSIS, MICROSCOPIC (REFLEX): Bacteria, UA: NONE SEEN

## 2023-12-25 LAB — ETHANOL: Alcohol, Ethyl (B): <15 mg/dL

## 2023-12-25 LAB — CBG MONITORING, ED
Glucose-Capillary: 132 mg/dL — ABNORMAL HIGH (ref 70–99)
Glucose-Capillary: 165 mg/dL — ABNORMAL HIGH (ref 70–99)
Glucose-Capillary: 120 mg/dL — ABNORMAL HIGH (ref 70–99)

## 2023-12-25 LAB — PROTIME-INR
INR: 1 (ref 0.8–1.2)
Prothrombin Time: 13.8 s (ref 11.4–15.2)

## 2023-12-25 LAB — APTT: aPTT: 30 s (ref 24–36)

## 2023-12-25 MED ORDER — ASPIRIN 81 MG PO TBEC
81.0000 mg | DELAYED_RELEASE_TABLET | Freq: Every day | ORAL | Status: DC
  Administered 2023-12-26: 81 mg via ORAL
  Filled 2023-12-25: qty 1

## 2023-12-25 MED ORDER — INSULIN GLARGINE 100 UNIT/ML ~~LOC~~ SOLN
5.0000 [IU] | Freq: Every day | SUBCUTANEOUS | Status: DC
  Filled 2023-12-25: qty 0.05

## 2023-12-25 MED ORDER — ACETAMINOPHEN 160 MG/5ML PO SOLN: 650.0000 mg | ORAL | Status: DC | PRN

## 2023-12-25 MED ORDER — IOHEXOL 350 MG/ML SOLN
100.0000 mL | Freq: Once | INTRAVENOUS | Status: AC | PRN
  Administered 2023-12-25: 100 mL via INTRAVENOUS

## 2023-12-25 MED ORDER — SODIUM CHLORIDE 0.9 % IV SOLN: INTRAVENOUS | Status: AC

## 2023-12-25 MED ORDER — ACETAMINOPHEN 650 MG RE SUPP: 650.0000 mg | RECTAL | Status: DC | PRN

## 2023-12-25 MED ORDER — INSULIN ASPART 100 UNIT/ML IJ SOLN
0.0000 [IU] | Freq: Three times a day (TID) | INTRAMUSCULAR | Status: DC
  Administered 2023-12-25: 3 [IU] via SUBCUTANEOUS
  Administered 2023-12-26 – 2023-12-27 (×3): 2 [IU] via SUBCUTANEOUS
  Filled 2023-12-25: qty 3
  Filled 2023-12-25: qty 2

## 2023-12-25 MED ORDER — STROKE: EARLY STAGES OF RECOVERY BOOK
Freq: Once | Status: AC
  Filled 2023-12-25: qty 1

## 2023-12-25 MED ORDER — ESCITALOPRAM OXALATE 10 MG PO TABS
5.0000 mg | ORAL_TABLET | Freq: Every day | ORAL | Status: DC
  Administered 2023-12-26 – 2023-12-27 (×2): 5 mg via ORAL
  Filled 2023-12-25 (×2): qty 1

## 2023-12-25 MED ORDER — INSULIN GLARGINE-YFGN 100 UNIT/ML ~~LOC~~ SOLN: 5.0000 [IU] | Freq: Every day | SUBCUTANEOUS | Status: DC

## 2023-12-25 MED ORDER — CLOPIDOGREL BISULFATE 75 MG PO TABS
75.0000 mg | ORAL_TABLET | Freq: Every day | ORAL | Status: DC
  Administered 2023-12-26 – 2023-12-27 (×2): 75 mg via ORAL
  Filled 2023-12-25 (×2): qty 1

## 2023-12-25 MED ORDER — ACETAMINOPHEN 325 MG PO TABS
650.0000 mg | ORAL_TABLET | ORAL | Status: DC | PRN
  Administered 2023-12-26 – 2023-12-27 (×3): 650 mg via ORAL
  Filled 2023-12-25 (×3): qty 2

## 2023-12-25 MED ORDER — SENNOSIDES-DOCUSATE SODIUM 8.6-50 MG PO TABS: 1.0000 | ORAL_TABLET | Freq: Every evening | ORAL | Status: DC | PRN

## 2023-12-25 MED ORDER — ATORVASTATIN CALCIUM 80 MG PO TABS
80.0000 mg | ORAL_TABLET | Freq: Every day | ORAL | Status: DC
  Administered 2023-12-26 – 2023-12-27 (×2): 80 mg via ORAL
  Filled 2023-12-25 (×2): qty 1

## 2023-12-25 MED ORDER — SODIUM CHLORIDE 0.9% FLUSH
3.0000 mL | Freq: Once | INTRAVENOUS | Status: AC
  Administered 2023-12-25: 3 mL via INTRAVENOUS

## 2023-12-25 NOTE — Consult Note (Signed)
 NEUROLOGY CONSULT NOTE   Date of service: December 25, 2023 Patient Name: Jimmy Callahan MRN:  [id number] DOB:  Sep 01, 1955 Chief Complaint: CODE STROKE Requesting Provider: Ula Prentice SAUNDERS, MD  History of Present Illness  Jimmy Callahan is a 68 y.o. male with hx of L TCAR 2 weeks ago (DAPT at home), DM, HTN, HLD who was brought into the ED by family d/t right arm weakness and aphasia. He was last seen normal last night before bed and on awakening this morning, had right arm weakness and mild aphasia. Activated a CODE STROKE in ED Triage.   On exam, patient is confused to his age, mild right facial droop, mild aphasia with word-finding difficulty and paucity of speech, mild right arm weakness, no sensory deficit. CTH negative. CTA shows patent left ICA stent with no LVO. He endorses compliance with his Aspirin  and Plavix  ever since his procedure. Patient is outside the window for TNK, no LVO for EVT procedure.   Patient taken to ED 29. Family at bedside, updated on assessment and current plan of care, also discussed with EDP.   LKW: 12/20 2200-2300 Modified rankin score: 0-Completely asymptomatic and back to baseline post- stroke IV Thrombolysis: No, outside of window EVT: No, no LVO   NIHSS components Score: Comment  1a Level of Conscious 0[x]  1[]  2[]  3[]      1b LOC Questions 0[]  1[x]  2[]       1c LOC Commands 0[x]  1[]  2[]       2 Best Gaze 0[x]  1[]  2[]       3 Visual 0[x]  1[]  2[]  3[]      4 Facial Palsy 0[]  1[x]  2[]  3[]      5a Motor Arm - left 0[]  1[x]  2[]  3[]  4[]  UN[]    5b Motor Arm - Right 0[x]  1[]  2[]  3[]  4[]  UN[]    6a Motor Leg - Left 0[x]  1[]  2[]  3[]  4[]  UN[]    6b Motor Leg - Right 0[x]  1[]  2[]  3[]  4[]  UN[]    7 Limb Ataxia 0[x]  1[]  2[]  UN[]      8 Sensory 0[x]  1[]  2[]  UN[]      9 Best Language 0[]  1[x]  2[]  3[]      10 Dysarthria 0[x]  1[]  2[]  UN[]      11 Extinct. and Inattention 0[x]  1[]  2[]       TOTAL:   4      ROS  Comprehensive ROS performed and pertinent positives  documented in HPI   Past History   Past Medical History:  Diagnosis Date   Carotid artery occlusion    Diabetes mellitus without complication (HCC)    Hyperlipidemia    Hypertension     Past Surgical History:  Procedure Laterality Date   HERNIA REPAIR Bilateral 1971   TRANSCAROTID ARTERY REVASCULARIZATION  Left 12/05/2023   Procedure: LEFT TRANSCAROTID ARTERY REVASCULARIZATION;  Surgeon: Lanis Fonda BRAVO, MD;  Location: [facility] OR;  Service: Vascular;  Laterality: Left;   ULTRASOUND GUIDANCE FOR VASCULAR ACCESS Right 12/05/2023   Procedure: ULTRASOUND GUIDANCE, FOR VASCULAR ACCESS;  Surgeon: Lanis Fonda BRAVO, MD;  Location: [facility] OR;  Service: Vascular;  Laterality: Right;    Family History: History reviewed. No pertinent family history.  Social History  reports that he has never smoked. He has never used smokeless tobacco. He reports that he does not currently use drugs. He reports that he does not drink alcohol.  Allergies[1]  Medications  Current Medications[2]  Vitals   Vitals:   12/25/23 1120 12/25/23 1121  BP: (!) 152/100   Pulse: 100   Resp:  18   SpO2: 96%   Weight:  88.5 kg  Height:  5' 9 (1.753 m)    Body mass index is 28.8 kg/m.   Physical Exam   Constitutional: Appears well-developed and well-nourished.  Cardiovascular: Normal rate and regular rhythm.  Respiratory: Effort normal, non-labored breathing.   Neurologic Examination   Neuro: Mental Status: Patient is awake, alert, oriented to person, place, month, year, and situation. Confused to age.  Signs of mild aphasia with word-finding difficulty and paucity of speech Cranial Nerves: II: Visual Fields are full. Pupils are equal, round, and reactive to light.   III,IV, VI: EOMI without ptosis or diploplia.  V: Facial sensation is symmetric to light touch VII: Mild right facial droop  VIII: hearing is intact to voice X: Uvula elevates symmetrically XI: Shoulder shrug is symmetric. XII: tongue is  midline without atrophy or fasciculations.  Motor: Tone is normal. Bulk is normal.  RUE: mild drift, 4-/5 Sensory: Sensation is symmetric to light touch in the arms and legs. Cerebellar: FNF intact but decreased on right.    Labs/Imaging/Neurodiagnostic studies   CBC: No results for input(s): WBC, NEUTROABS, HGB, HCT, MCV, PLT in the last 168 hours. Basic Metabolic Panel:  Lab Results  Component Value Date   NA 140 12/06/2023   K 3.9 12/06/2023   CO2 25 12/06/2023   GLUCOSE 126 (H) 12/06/2023   BUN 14 12/06/2023   CREATININE 0.88 12/06/2023   CALCIUM  8.5 (L) 12/06/2023   GFRNONAA >60 12/06/2023   GFRAA 85 (L) 02/04/2014   Lipid Panel:  Lab Results  Component Value Date   LDLCALC 31 12/06/2023   HgbA1c:  Lab Results  Component Value Date   HGBA1C 6.8 (H) 11/30/2023   Urine Drug Screen: No results found for: LABOPIA, COCAINSCRNUR, LABBENZ, AMPHETMU, THCU, LABBARB  Alcohol Level No results found for: [facility] INR  Lab Results  Component Value Date   INR 1.0 11/30/2023   APTT  Lab Results  Component Value Date   APTT 28 11/30/2023   AED levels: No results found for: PHENYTOIN, ZONISAMIDE, LAMOTRIGINE, LEVETIRACETA  CT Head without contrast(Personally reviewed): No acute intracranial abnormality.  ASPECTS score 10. Moderately advanced periventricular and subcortical white matter hypoattenuation for age.  CT angio Head and Neck with contrast(Personally reviewed):  Patent stent in the cervical segment of the left internal carotid artery with mild residual stenosis, less than 20%. Mild calcific plaque in the proximal right internal carotid artery without associated stenosis. Dominant right vertebral artery and mildly hypoplastic left vertebral artery; otherwise unremarkable vertebrobasilar system. Fetal origin of the right posterior cerebral artery. Common origin of the brachiocephalic artery and left common carotid artery from the  aortic arch. 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  MRI Brain(Personally reviewed): pending   ASSESSMENT   Jimmy Callahan is a 68 y.o. male with hx of L TCAR 2 weeks ago (DAPT at home), DM, HTN, HLD who was brought into the ED by family d/t right arm weakness and aphasia. He was last seen normal last night before bed. CODE STROKE activated in ED Triage.   On exam, patient is confused to his age, mild right facial droop, mild aphasia with word-finding difficulty and paucity of speech, mild right arm weakness, no sensory deficit.   CTH negative. CTA shows patent left ICA stent with no LVO. He endorses compliance with his Aspirin  and Plavix  ever since his procedure. Patient is outside the window for TNK, no LVO for EVT procedure.   Impression:  Stroke versus Stroke-like symptoms in patient with known vessel disease and multiple risk factors.   RECOMMENDATIONS    Stroke versus Stroke-like symptoms  - Frequent Neuro checks per stroke unit protocol - MRI Brain stroke protocol - Vascular consult, discussed with EDP  - TTE completed November 2025, no repeat needed - Lipid panel completed 12/06/2023. LDL: 31.  Patient is already on Lipitor 80mg  - A1C: 6.8 11/30/2023, no need for repeat. Recommend close f/u with PCP - Antithrombotic - will defer to Vascular Surgery due to recent stent procedure. Patient was placed on DAPT with Aspirin  and Plavix  post-op - P2Y12 lab ordered to assess for plavix  response  - Smoking cessation - will counsel patient - SBP goal - <220, PRN labetalol  if HR>60 and PRN Hydralazine  if HR<60 - Telemetry monitoring for arrhythmia - 72h - Swallow screen - will be performed prior to PO intake - Stroke education - will be given - PT/OT/SLP - Dispo: admit for further workup  ______________________________________________________________________   Signed, Rocky JAYSON Likes, NP [facility]   I have seen the patient and reviewed the above  note.  68 year old male with left hemispheric symptoms in the setting of recent left ICA stent.  There is not much in the way of in-stent thrombosis, but I did discuss with vascular surgery and given that he is a Plavix  responder we will continue DAPT with aspirin  and Plavix .  Echocardiogram and telemetry will need to be pursued as well, to ensure no clear cardiac source  Aisha Seals, MD [facility]  If 7pm- 7am, please page neurology on call as listed in AMION.     [1]  Allergies Allergen Reactions   Paroxetine Hcl     Other Reaction(s): nausea,lightheaded   Telmisartan-Hctz     Other Reaction(s): orthostatic/lightheadedness  [2]  Current Facility-Administered Medications:    sodium chloride  flush (NS) 0.9 % injection 3 mL, 3 mL, Intravenous, Once, Nivia Colon, PA-C  Current Outpatient Medications:    acetaminophen  (TYLENOL ) 650 MG suppository, Place 650 mg rectally every 4 (four) hours as needed for mild pain (pain score 1-3) or moderate pain (pain score 4-6)., Disp: , Rfl:    amLODipine  (NORVASC ) 10 MG tablet, Take 10 mg by mouth daily., Disp: , Rfl:    aspirin  EC 81 MG tablet, Take 1 tablet (81 mg total) by mouth daily. Swallow whole., Disp: 30 tablet, Rfl: 12   atorvastatin  (LIPITOR) 80 MG tablet, Take 80 mg by mouth daily., Disp: , Rfl:    Cholecalciferol (VITAMIN D3) 25 MCG CAPS, Take 25 mcg by mouth daily at 12 noon., Disp: , Rfl:    clopidogrel  (PLAVIX ) 75 MG tablet, Take 1 tablet (75 mg total) by mouth daily., Disp: 30 tablet, Rfl: 6   Coenzyme Q10-Vitamin E (QUNOL ULTRA COQ10 PO), Take 1 tablet by mouth daily., Disp: , Rfl:    escitalopram  (LEXAPRO ) 5 MG tablet, Take 5 mg by mouth daily., Disp: , Rfl:    Ferrous Sulfate  (IRON) 325 (65 Fe) MG TABS, Take 1 tablet by mouth daily at 12 noon., Disp: , Rfl:    fluorouracil (EFUDEX) 5 % cream, Apply 1 Application topically daily as needed., Disp: , Rfl:    JARDIANCE  25 MG TABS tablet, Take 25 mg by mouth  daily., Disp: , Rfl:    LANTUS  SOLOSTAR 100 UNIT/ML Solostar Pen, Inject 16 Units into the skin daily., Disp: , Rfl:    metFORMIN  (GLUCOPHAGE ) 1000 MG tablet, Take 1,000 mg by mouth 2 (two) times daily with a  meal., Disp: , Rfl:    POTASSIUM CHLORIDE  PO, Take 3 tablets by mouth daily. 99mg , Disp: , Rfl:    valsartan (DIOVAN) 320 MG tablet, Take 320 mg by mouth daily., Disp: , Rfl:

## 2023-12-25 NOTE — ED Triage Notes (Addendum)
 Pt via POV c/o stroke-like symptoms. Pt presents with confusion; denies pain. VAN+ in triage, activate code stroke. LKW 11pm last night. Pt is mildly aphasic, right-sided weakness noted and unsteady gait. Recent carotid procedure and pt states he is compliant with anticoagulation as prescribed.

## 2023-12-25 NOTE — ED Triage Notes (Addendum)
 Pt reports waking up at about 0900 this morning with left sided weakness, last know well last night between 2200-2300 when he went to bed last night. Pt brought back to triage. PA Lonni notified

## 2023-12-25 NOTE — ED Provider Notes (Signed)
 " [place] EMERGENCY DEPARTMENT AT [facility] Provider Note   CSN: [id number] Arrival date & time: 12/25/23  1108     Patient presents with: Weakness and Code Stroke   Jimmy Callahan is a 68 y.o. male.  {Add pertinent medical, surgical, social history, OB history to HPI:5642} 68 year old male with recent carotid endarterectomy presenting to the emergency department today with right-sided weakness.  The patient went to bed at 11 PM.  Woke up this morning and realized he was having difficulty ambulating.  He came to the ER today for further evaluation regarding this due to these ongoing symptoms.  He denies any headache.  Reports taking medications as prescribed.  He is denying any speech difficulty but does seem to have some dysarthria/mild aphasia on exam.     Weakness      Prior to Admission medications  Medication Sig Start Date End Date Taking? Authorizing Provider  acetaminophen  (TYLENOL ) 650 MG suppository Place 650 mg rectally every 4 (four) hours as needed for mild pain (pain score 1-3) or moderate pain (pain score 4-6).    [provider]  amLODipine  (NORVASC ) 10 MG tablet Take 10 mg by mouth daily.    [provider]  aspirin  EC 81 MG tablet Take 1 tablet (81 mg total) by mouth daily. Swallow whole. 11/24/23   Robins, Joshua E, MD  atorvastatin  (LIPITOR) 80 MG tablet Take 80 mg by mouth daily.    [provider]  Cholecalciferol (VITAMIN D3) 25 MCG CAPS Take 25 mcg by mouth daily at 12 noon.    [provider]  clopidogrel  (PLAVIX ) 75 MG tablet Take 1 tablet (75 mg total) by mouth daily. 11/24/23   Robins, Joshua E, MD  Coenzyme Q10-Vitamin E (QUNOL ULTRA COQ10 PO) Take 1 tablet by mouth daily.    [provider]  escitalopram  (LEXAPRO ) 5 MG tablet Take 5 mg by mouth daily.    [provider]  Ferrous Sulfate  (IRON) 325 (65 Fe) MG TABS Take 1 tablet by mouth daily at 12 noon.    [provider]   fluorouracil (EFUDEX) 5 % cream Apply 1 Application topically daily as needed.    [provider]  JARDIANCE  25 MG TABS tablet Take 25 mg by mouth daily.    [provider]  LANTUS  SOLOSTAR 100 UNIT/ML Solostar Pen Inject 16 Units into the skin daily.    [provider]  metFORMIN  (GLUCOPHAGE ) 1000 MG tablet Take 1,000 mg by mouth 2 (two) times daily with a meal.    [provider]  POTASSIUM CHLORIDE  PO Take 3 tablets by mouth daily. 99mg     [provider]  valsartan (DIOVAN) 320 MG tablet Take 320 mg by mouth daily.    [provider]    Allergies: Paroxetine hcl and Telmisartan-hctz    Review of Systems  Neurological:  Positive for weakness.  All other systems reviewed and are negative.   Updated Vital Signs BP (!) 152/100 (BP Location: Right Arm)   Pulse 100   Resp 18   Ht 5' 9 (1.753 m)   Wt 88.5 kg   SpO2 96%   BMI 28.80 kg/m   Physical Exam Vitals and nursing note reviewed.   Gen: NAD Eyes: PERRL, EOMI HEENT: no oropharyngeal swelling Neck: trachea midline Resp: clear to auscultation bilaterally Card: RRR, no murmurs, rubs, or gallops Abd: nontender, nondistended Extremities: no calf tenderness, no edema Vascular: 2+ radial pulses bilaterally, 2+ DP pulses bilaterally Neuro: The patient  has weakness in the right upper and right lower extremity with some mild dysarthria and aphasia, please see NIH stroke scale from stroke team who saw the patient at the same time of my evaluation Skin: no rashes Psyc: acting appropriately   (all labs ordered are listed, but only abnormal results are displayed) Labs Reviewed  CBC - Abnormal; Notable for the following components:      Result Value   WBC 11.5 (*)    All other components within normal limits  DIFFERENTIAL - Abnormal; Notable for the following components:   Neutro Abs 8.8 (*)    All other components within normal limits  COMPREHENSIVE METABOLIC PANEL WITH  GFR - Abnormal; Notable for the following components:   CO2 20 (*)    Glucose, Bld 133 (*)    ALT 47 (*)    All other components within normal limits  CBG MONITORING, ED - Abnormal; Notable for the following components:   Glucose-Capillary 132 (*)    All other components within normal limits  I-STAT CHEM 8, ED - Abnormal; Notable for the following components:   Glucose, Bld 139 (*)    TCO2 21 (*)    All other components within normal limits  PROTIME-INR  APTT  ETHANOL  PLATELET INHIBITION P2Y12  URINE DRUG SCREEN  URINALYSIS, ROUTINE W REFLEX MICROSCOPIC    EKG: None  Radiology: CT ANGIO HEAD NECK W WO CM W PERF (CODE STROKE) Result Date: 12/25/2023 EXAM: CTA Head and Neck with Perfusion 12/25/2023 11:47:46 AM TECHNIQUE: CTA of the head and neck was performed without and with the administration of 100 mL of iohexol  (OMNIPAQUE ) 350 MG/ML injection. 3D postprocessing with multiplanar reconstructions and MIPs was performed to evaluate the vascular anatomy. Cerebral perfusion analysis using computed tomography with contrast administration, including post-processing of parametric maps with determination of cerebral blood flow, cerebral blood volume, mean transit time and time-to-maximum. Automated exposure control, iterative reconstruction, and/or weight based adjustment of the mA/kV was utilized to reduce the radiation dose to as low as reasonably achievable. COMPARISON: CT of the head dated 12/25/2023 and a CT angiogram of the head and neck dated 11/17/2023. CLINICAL HISTORY: Neuro deficit, acute, stroke suspected. FINDINGS: CTA NECK: AORTIC ARCH AND ARCH VESSELS: No dissection or arterial injury. There is common origin of the brachiocephalic artery and left common carotid artery from the aortic arch. The arch demonstrates mild calcific atheromatous disease. No significant stenosis of the brachiocephalic or subclavian arteries. CERVICAL CAROTID ARTERIES: No dissection or arterial injury. Since  the previous CT angiogram, there has been stenting of the cervical segment of the left internal carotid artery. The stent is patent with mild residual stenosis, less than 20%. There is mild calcific plaque present within the proximal right internal carotid artery, with no associated stenosis. No hemodynamically significant stenosis by NASCET criteria. CERVICAL VERTEBRAL ARTERIES: No dissection or arterial injury. The right vertebral artery is dominant and the left is mildly hypoplastic. No significant stenosis. LUNGS AND MEDIASTINUM: Unremarkable. SOFT TISSUES: No acute abnormality. BONES: No acute abnormality. CTA HEAD: ANTERIOR CIRCULATION: No significant stenosis of the intracranial internal carotid arteries. No significant stenosis of the anterior cerebral arteries. No significant stenosis of the middle cerebral arteries. No aneurysm. POSTERIOR CIRCULATION: There is fetal origin of the right posterior cerebral artery. No significant stenosis of the posterior cerebral arteries. No significant stenosis of the basilar artery. No significant stenosis of the vertebral arteries. No aneurysm. OTHER: No dural venous sinus thrombosis on this non-dedicated study. CT PERFUSION: EXAM  QUALITY: Exam quality is adequate with diagnostic perfusion maps. No significant motion artifact. Appropriate arterial inflow and venous outflow curves. CORE INFARCT (CBF<30% volume): 0 mL TOTAL HYPOPERFUSION (Tmax>6s volume): 0 mL PENUMBRA: Mismatch volume: 0 mL Mismatch ratio: not applicable Location: not applicable Please note in the report that the above findings were communicated to Doctor Eulah Dover at 11:56 AM 12/25/2023. IMPRESSION: 1. Patent stent in the cervical segment of the left internal carotid artery with mild residual stenosis, less than 20%. 2. Mild calcific plaque in the proximal right internal carotid artery without associated stenosis. 3. Dominant right vertebral artery and mildly hypoplastic left vertebral artery;  otherwise unremarkable vertebrobasilar system. 4. Fetal origin of the right posterior cerebral artery. 5. Common origin of the brachiocephalic artery and left common carotid artery from the aortic arch. 6. 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 7. Findings communicated to Dr. Michaela at 11:56 AM on 12/25/23. Electronically signed by: Evalene Coho MD 12/25/2023 12:03 PM EST RP Workstation: [id number]   CT HEAD CODE STROKE WO CONTRAST Result Date: 12/25/2023 EXAM: CT HEAD WITHOUT CONTRAST 12/25/2023 11:30:39 AM TECHNIQUE: CT of the head was performed without the administration of intravenous contrast. Automated exposure control, iterative reconstruction, and/or weight based adjustment of the mA/kV was utilized to reduce the radiation dose to as low as reasonably achievable. COMPARISON: CT head without contrast 11/17/2023. Periventricular and subcortical white matter hypoattenuation is moderately advanced for age. No acute or focal cortical abnormalities are present. CLINICAL HISTORY: Neuro deficit, acute, stroke suspected. Right-sided weakness. FINDINGS: BRAIN AND VENTRICLES: No acute hemorrhage. No evidence of acute infarct. No hydrocephalus. No extra-axial collection. No mass effect or midline shift. Periventricular and subcortical white matter hypoattenuation is moderately advanced for age. No acute or focal cortical abnormalities are present. [place] Stroke Program Early CT Score (ASPECTS) ----- Ganglionic (caudate, ic, lentiform nucleus, insula, M1-m3): 7 Supraganglionic (m4-m6): 3 Total: 10 ORBITS: No acute abnormality. SINUSES: No acute abnormality. SOFT TISSUES AND SKULL: No acute soft tissue abnormality. No skull fracture. IMPRESSION: 1. No acute intracranial abnormality. ASPECTS score 10. 2. Moderately advanced periventricular and subcortical white matter hypoattenuation for age. 3. these results were communicated to Dr. Michaela at 11:35 AM on 12/25/2023 by secure text page  via the [place] messaging system. Electronically signed by: Lonni Necessary MD 12/25/2023 11:35 AM EST RP Workstation: [id number]    {Document cardiac monitor, telemetry assessment procedure when appropriate:32947} Procedures   Medications Ordered in the ED  sodium chloride  flush (NS) 0.9 % injection 3 mL (has no administration in time range)   stroke: early stages of recovery book (has no administration in time range)  iohexol  (OMNIPAQUE ) 350 MG/ML injection 100 mL (100 mLs Intravenous Contrast Given 12/25/23 1148)      {Click here for ABCD2, HEART and other calculators REFRESH Note before signing:1}                              Medical Decision Making 68 year old male with recent carotid endarterectomy presenting to the emergency department today with right sided weakness.  The patient is outside the window for thrombolytics at this time but does have upper and lower extremity involvement here on exam.  I will further evaluate him here with stroke workup.  Discussed with Dr. Michaela.  He will likely require admission.  The patient's labs are largely noncontributory.  There was some residual stenosis on CT angiogram.  Dr. Michaela did request that I speak with  vascular surgery.  Calls placed to vascular.  He did recommend admission to the hospitalist service for further stroke evaluation.  MRI is ordered.  He will be admitted for further evaluation management.  CRITICAL CARE Performed by: Prentice JONELLE Medicus   Total critical care time: 35 minutes  Critical care time was exclusive of separately billable procedures and treating other patients.  Critical care was necessary to treat or prevent imminent or life-threatening deterioration.  Critical care was time spent personally by me on the following activities: development of treatment plan with patient and/or surrogate as well as nursing, discussions with consultants, evaluation of patient's response to treatment, examination of  patient, obtaining history from patient or surrogate, ordering and performing treatments and interventions, ordering and review of laboratory studies, ordering and review of radiographic studies, pulse oximetry and re-evaluation of patient's condition.   Amount and/or Complexity of Data Reviewed Radiology: ordered.     {Document critical care time when appropriate  Document review of labs and clinical decision tools ie CHADS2VASC2, etc  Document your independent review of radiology images and any outside records  Document your discussion with family members, caretakers and with consultants  Document social determinants of health affecting pt's care  Document your decision making why or why not admission, treatments were needed:32947:::1}   Final diagnoses:  Acute right-sided weakness    ED Discharge Orders     None        "

## 2023-12-25 NOTE — ED Notes (Signed)
 Patient in MRI, unable to perform NIH stroke scale at this time. Will assess upon arrival back to room.

## 2023-12-25 NOTE — H&P (Signed)
 " History and Physical   Jimmy Callahan FMW:[id number] DOB: 04/03/1955 DOA: 12/25/2023  PCP: Regino Slater, MD   Patient coming from: Home  Chief Complaint: Right-sided weakness  HPI: Jimmy Callahan is a 68 y.o. male with medical history significant of hypertension, hyperlipidemia, diabetes, anxiety, carotid artery disease presenting with right-sided weakness.  Patient woke up at 9 AM with right-sided weakness and maybe some word finding difficulty.  Last normal was 11 PM the night before.  Denies fevers, chills and chest pain, shortness of breath, abdominal pain, constipation, diarrhea, nausea, vomiting.  ED Course: Vitals in the ED notable for blood pressure in the 150s systolic.  Lab workup included CMP with bicarb 20, glucose 133, ALT 47.  CBC with mild leukocytosis to 11.5.  PT, PTT, INR within normal limits.  Ethanol level, UDS, urinalysis pending.  Lipid panel ordered for tomorrow morning and platelet inhibition lab ordered to assess response to Plavix  he is already taking.  CT head showed no acute abnormality.  CTA head and neck with perfusion showed left carotid stenosis status post stent with only 20% residual stenosis, mild right carotid plaque, dominant right vertebral artery.  MRI brain ordered and is pending.  Neurology consulted, have seen the patient and placed orders.  Vascular surgery consulted with no additional intervention needed just to ensure patient is taking his antiplatelet regimen.  Review of Systems: As per HPI otherwise all other systems reviewed and are negative.  Past Medical History:  Diagnosis Date   Carotid artery occlusion    Diabetes mellitus without complication (HCC)    Hyperlipidemia    Hypertension     Past Surgical History:  Procedure Laterality Date   HERNIA REPAIR Bilateral 1971   TRANSCAROTID ARTERY REVASCULARIZATION  Left 12/05/2023   Procedure: LEFT TRANSCAROTID ARTERY REVASCULARIZATION;  Surgeon: Lanis Fonda BRAVO, MD;   Location: [facility] OR;  Service: Vascular;  Laterality: Left;   ULTRASOUND GUIDANCE FOR VASCULAR ACCESS Right 12/05/2023   Procedure: ULTRASOUND GUIDANCE, FOR VASCULAR ACCESS;  Surgeon: Lanis Fonda BRAVO, MD;  Location: [facility] OR;  Service: Vascular;  Laterality: Right;    Social History  reports that he has never smoked. He has never used smokeless tobacco. He reports that he does not currently use drugs. He reports that he does not drink alcohol.  Allergies[1]  History reviewed. No pertinent family history.  Prior to Admission medications  Medication Sig Start Date End Date Taking? Authorizing Provider  acetaminophen  (TYLENOL ) 650 MG suppository Place 650 mg rectally every 4 (four) hours as needed for mild pain (pain score 1-3) or moderate pain (pain score 4-6).   Yes [provider]  amLODipine  (NORVASC ) 10 MG tablet Take 10 mg by mouth daily.   Yes [provider]  aspirin  EC 81 MG tablet Take 1 tablet (81 mg total) by mouth daily. Swallow whole. 11/24/23  Yes Lanis Fonda BRAVO, MD  atorvastatin  (LIPITOR) 80 MG tablet Take 80 mg by mouth daily.   Yes [provider]  Cholecalciferol (VITAMIN D3) 25 MCG CAPS Take 25 mcg by mouth daily at 12 noon.   Yes [provider]  clopidogrel  (PLAVIX ) 75 MG tablet Take 1 tablet (75 mg total) by mouth daily. 11/24/23  Yes Robins, Joshua E, MD  Coenzyme Q10-Vitamin E (QUNOL ULTRA COQ10 PO) Take 1 tablet by mouth daily.   Yes [provider]  escitalopram  (LEXAPRO ) 5 MG tablet Take 5 mg by mouth daily.   Yes [provider]  Ferrous Sulfate  (IRON) 325 (65 Fe) MG  TABS Take 1 tablet by mouth daily at 12 noon.   Yes [provider]  JARDIANCE  25 MG TABS tablet Take 25 mg by mouth daily.   Yes [provider]  LANTUS  SOLOSTAR 100 UNIT/ML Solostar Pen Inject 16 Units into the skin daily.   Yes [provider]  metFORMIN  (GLUCOPHAGE ) 1000 MG tablet Take 1,000 mg by mouth 2 (two) times daily  with a meal.   Yes [provider]  POTASSIUM CHLORIDE  PO Take 3 tablets by mouth daily. 99mg    Yes [provider]  valsartan (DIOVAN) 320 MG tablet Take 320 mg by mouth daily.   Yes [provider]    Physical Exam: Vitals:   12/25/23 1120 12/25/23 1121  BP: (!) 152/100   Pulse: 100   Resp: 18   SpO2: 96%   Weight:  88.5 kg  Height:  5' 9 (1.753 m)    Physical Exam Constitutional:      General: He is not in acute distress.    Appearance: Normal appearance.  HENT:     Head: Normocephalic and atraumatic.     Mouth/Throat:     Mouth: Mucous membranes are moist.     Pharynx: Oropharynx is clear.  Eyes:     Extraocular Movements: Extraocular movements intact.     Pupils: Pupils are equal, round, and reactive to light.  Cardiovascular:     Rate and Rhythm: Normal rate and regular rhythm.     Pulses: Normal pulses.     Heart sounds: Normal heart sounds.  Pulmonary:     Effort: Pulmonary effort is normal. No respiratory distress.     Breath sounds: Normal breath sounds.  Abdominal:     General: Bowel sounds are normal. There is no distension.     Palpations: Abdomen is soft.     Tenderness: There is no abdominal tenderness.  Musculoskeletal:        General: No swelling or deformity.  Skin:    General: Skin is warm and dry.  Neurological:     Comments: Mental Status: Patient is awake, alert, oriented x3 Some aphasia/word finding difficulty Cranial Nerves: II: Pupils equal, round, and reactive to light.   III,IV, VI: EOMI without ptosis or diploplia.  V: Facial sensation is symmetric to light touch. VII: Facial movement is symmetric.  VIII: hearing is intact to voice X: Uvula elevates symmetrically XI: Shoulder shrug is symmetric. XII: tongue is midline without atrophy or fasciculations.  Motor: Good effort thorughout, at Least 4/5 RUE and RLE.  5/5 LUE and LLE.  Sensory: Sensation is grossly intact bilateral UEs & LEs Cerebellar:  Finger-Nose intact bilalat    Labs on Admission: I have personally reviewed following labs and imaging studies  CBC: Recent Labs  Lab 12/25/23 1136 12/25/23 1140  WBC 11.5*  --   NEUTROABS 8.8*  --   HGB 14.3 14.6  HCT 41.9 43.0  MCV 88.4  --   PLT 289  --     Basic Metabolic Panel: Recent Labs  Lab 12/25/23 1136 12/25/23 1140  NA 136 139  K 4.1 4.1  CL 102 105  CO2 20*  --   GLUCOSE 133* 139*  BUN 17 18  CREATININE 0.83 0.90  CALCIUM  9.7  --     GFR: Estimated Creatinine Clearance: 86.4 mL/min (by C-G formula based on SCr of 0.9 mg/dL).  Liver Function Tests: Recent Labs  Lab 12/25/23 1136  AST 28  ALT 47*  ALKPHOS 51  BILITOT 0.3  PROT 7.6  ALBUMIN 4.7    Urine analysis:    Component Value Date/Time   COLORURINE AMBER (A) 11/30/2023 1246   APPEARANCEUR HAZY (A) 11/30/2023 1246   LABSPEC 1.031 (H) 11/30/2023 1246   PHURINE 5.0 11/30/2023 1246   GLUCOSEU >=500 (A) 11/30/2023 1246   HGBUR NEGATIVE 11/30/2023 1246   BILIRUBINUR NEGATIVE 11/30/2023 1246   KETONESUR NEGATIVE 11/30/2023 1246   PROTEINUR NEGATIVE 11/30/2023 1246   UROBILINOGEN 0.2 02/04/2014 0907   NITRITE NEGATIVE 11/30/2023 1246   LEUKOCYTESUR NEGATIVE 11/30/2023 1246    Radiological Exams on Admission: CT ANGIO HEAD NECK W WO CM W PERF (CODE STROKE) Result Date: 12/25/2023 EXAM: CTA Head and Neck with Perfusion 12/25/2023 11:47:46 AM TECHNIQUE: CTA of the head and neck was performed without and with the administration of 100 mL of iohexol  (OMNIPAQUE ) 350 MG/ML injection. 3D postprocessing with multiplanar reconstructions and MIPs was performed to evaluate the vascular anatomy. Cerebral perfusion analysis using computed tomography with contrast administration, including post-processing of parametric maps with determination of cerebral blood flow, cerebral blood volume, mean transit time and time-to-maximum. Automated exposure control, iterative reconstruction, and/or weight based  adjustment of the mA/kV was utilized to reduce the radiation dose to as low as reasonably achievable. COMPARISON: CT of the head dated 12/25/2023 and a CT angiogram of the head and neck dated 11/17/2023. CLINICAL HISTORY: Neuro deficit, acute, stroke suspected. FINDINGS: CTA NECK: AORTIC ARCH AND ARCH VESSELS: No dissection or arterial injury. There is common origin of the brachiocephalic artery and left common carotid artery from the aortic arch. The arch demonstrates mild calcific atheromatous disease. No significant stenosis of the brachiocephalic or subclavian arteries. CERVICAL CAROTID ARTERIES: No dissection or arterial injury. Since the previous CT angiogram, there has been stenting of the cervical segment of the left internal carotid artery. The stent is patent with mild residual stenosis, less than 20%. There is mild calcific plaque present within the proximal right internal carotid artery, with no associated stenosis. No hemodynamically significant stenosis by NASCET criteria. CERVICAL VERTEBRAL ARTERIES: No dissection or arterial injury. The right vertebral artery is dominant and the left is mildly hypoplastic. No significant stenosis. LUNGS AND MEDIASTINUM: Unremarkable. SOFT TISSUES: No acute abnormality. BONES: No acute abnormality. CTA HEAD: ANTERIOR CIRCULATION: No significant stenosis of the intracranial internal carotid arteries. No significant stenosis of the anterior cerebral arteries. No significant stenosis of the middle cerebral arteries. No aneurysm. POSTERIOR CIRCULATION: There is fetal origin of the right posterior cerebral artery. No significant stenosis of the posterior cerebral arteries. No significant stenosis of the basilar artery. No significant stenosis of the vertebral arteries. No aneurysm. OTHER: No dural venous sinus thrombosis on this non-dedicated study. CT PERFUSION: EXAM QUALITY: Exam quality is adequate with diagnostic perfusion maps. No significant motion artifact.  Appropriate arterial inflow and venous outflow curves. CORE INFARCT (CBF<30% volume): 0 mL TOTAL HYPOPERFUSION (Tmax>6s volume): 0 mL PENUMBRA: Mismatch volume: 0 mL Mismatch ratio: not applicable Location: not applicable Please note in the report that the above findings were communicated to Doctor Eulah Dover at 11:56 AM 12/25/2023. IMPRESSION: 1. Patent stent in the cervical segment of the left internal carotid artery with mild residual stenosis, less than 20%. 2. Mild calcific plaque in the proximal right internal carotid artery without associated stenosis. 3. Dominant right vertebral artery and mildly hypoplastic left vertebral artery; otherwise unremarkable vertebrobasilar system. 4. Fetal origin of the right posterior cerebral artery. 5. Common origin of the brachiocephalic artery and left common carotid artery from the  aortic arch. 6. 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 7. Findings communicated to Dr. Michaela at 11:56 AM on 12/25/23. Electronically signed by: Evalene Coho MD 12/25/2023 12:03 PM EST RP Workstation: [id number]   CT HEAD CODE STROKE WO CONTRAST Result Date: 12/25/2023 EXAM: CT HEAD WITHOUT CONTRAST 12/25/2023 11:30:39 AM TECHNIQUE: CT of the head was performed without the administration of intravenous contrast. Automated exposure control, iterative reconstruction, and/or weight based adjustment of the mA/kV was utilized to reduce the radiation dose to as low as reasonably achievable. COMPARISON: CT head without contrast 11/17/2023. Periventricular and subcortical white matter hypoattenuation is moderately advanced for age. No acute or focal cortical abnormalities are present. CLINICAL HISTORY: Neuro deficit, acute, stroke suspected. Right-sided weakness. FINDINGS: BRAIN AND VENTRICLES: No acute hemorrhage. No evidence of acute infarct. No hydrocephalus. No extra-axial collection. No mass effect or midline shift. Periventricular and subcortical white matter  hypoattenuation is moderately advanced for age. No acute or focal cortical abnormalities are present. [place] Stroke Program Early CT Score (ASPECTS) ----- Ganglionic (caudate, ic, lentiform nucleus, insula, M1-m3): 7 Supraganglionic (m4-m6): 3 Total: 10 ORBITS: No acute abnormality. SINUSES: No acute abnormality. SOFT TISSUES AND SKULL: No acute soft tissue abnormality. No skull fracture. IMPRESSION: 1. No acute intracranial abnormality. ASPECTS score 10. 2. Moderately advanced periventricular and subcortical white matter hypoattenuation for age. 3. these results were communicated to Dr. Michaela at 11:35 AM on 12/25/2023 by secure text page via the [facility] messaging system. Electronically signed by: Lonni Necessary MD 12/25/2023 11:35 AM EST RP Workstation: [id number]   EKG: Not performed in the emergency department   Assessment/Plan Active Problems:   Carotid stenosis, left   Anxiety disorder   Essential hypertension   Pure hypercholesterolemia   Type 2 diabetes mellitus with other specified complication (HCC)   Stroke (HCC)   Acute CVA > Patient presenting with focal right-sided weakness that was new onset this morning.  Some aphasia. > CT head and CTA head and neck in the ED showed no acute abnormality.  Cyst and residual carotid disease but is status post into the left with only 20% residual stenosis. > Neurology consulted and are following. - Appreciate neurology recommendations and assistance - Allow for permissive HTN  (systolic < 220 and diastolic < 120) - Continue home aspirin  and Plavix  for now  - Continue home statin - Echocardiogram recently done, will not repeat - Has recent A1C  - Lipid panel  - Tele monitoring  - SLP eval - PT/OT  Hypertension - Holding antihypertensives in the setting as above  Hyperlipidemia - Continue home atorvastatin   Anxiety - Continue Lexapro   Diabetes > 16 units long-acting daily at home - 5 units long-acting daily - SSI  DVT  prophylaxis: SCDs pending MRI, then likely Lovenox Code Status:   Full Family Communication:  Updated at bedside  Disposition Plan:   Patient is from:  Home  Anticipated DC to:  Home  Anticipated DC date:  1 to 2 days  Anticipated DC barriers: None  Consults called:  Neurology, EDP discussed with vascular surgery Admission status:  Observation, telemetry  Severity of Illness: The appropriate patient status for this patient is OBSERVATION. Observation status is judged to be reasonable and necessary in order to provide the required intensity of service to ensure the patient's safety. The patient's presenting symptoms, physical exam findings, and initial radiographic and laboratory data in the context of their medical condition is felt to place them at decreased risk for further clinical deterioration. Furthermore, it  is anticipated that the patient will be medically stable for discharge from the hospital within 2 midnights of admission.    Marsa KATHEE Scurry MD [facility]  How to contact the [facility] Attending or Consulting provider 7A - 7P or covering provider during after hours 7P -7A, for this patient?   Check the care team in [facility] and look for a) attending/consulting [facility] provider listed and b) the [facility] team listed Log into [email or web address] and use [place]'s universal password to access. If you do not have the password, please contact the hospital operator. Locate the [facility] provider you are looking for under [facility] and page to a number that you can be directly reached. If you still have difficulty reaching the provider, please page the [facility] (Director on Call) for the Hospitalists listed on amion for assistance.  12/25/2023, 2:07 PM       [1]  Allergies Allergen Reactions   Paroxetine Hcl     Other Reaction(s): nausea,lightheaded   Telmisartan-Hctz     Other Reaction(s): orthostatic/lightheadedness   "

## 2023-12-25 NOTE — ED Notes (Signed)
 RN called by lab asking if sample was sent by tube system as lab sample was clotted & hemolyzed according to lab. Lab told tech brought sample down, as reported by tech,observed by both RN and phlebotomist. Second sample drawn and taken down by RN, handed to lab staff.

## 2023-12-25 NOTE — ED Provider Triage Note (Signed)
 Emergency Medicine Provider Triage Evaluation Note  Jimmy Callahan , a 68 y.o. male  was evaluated in triage.  Pt complains of weakness. LKW 11pm last night.  Woke up this AM, having difficulty with speech and gait, R side weakness.  No headache, cp, sob, abd pain, numbness.  No changes in meds, no prior stroke  Review of Systems  Positive: As above Negative: As above  Physical Exam  There were no vitals taken for this visit. Gen:   Awake, no distress   Resp:  Normal effort  MSK:   Moves extremities without difficulty  Other:  Neurologic exam:  Speech clear, pupils equal round reactive to light, extraocular movements intact Normal peripheral visual fields Cranial nerves III through XII normal including no facial droop Follows commands, R arm/leg weakness  Sensation normal to light touch  finger to nose with dysmetria R pronator drift Gait unsteady   Medical Decision Making  Medically screening exam initiated at 11:18 AM.  Appropriate orders placed.  Charlie Gander was informed that the remainder of the evaluation will be completed by another provider, this initial triage assessment does not replace that evaluation, and the importance of remaining in the ED until their evaluation is complete.  Code stroke activated as pt is VAN positive   Nivia Colon, PA-C 12/25/23 1122

## 2023-12-26 ENCOUNTER — Other Ambulatory Visit (HOSPITAL_COMMUNITY): Payer: Self-pay

## 2023-12-26 ENCOUNTER — Telehealth (HOSPITAL_COMMUNITY): Payer: Self-pay

## 2023-12-26 DIAGNOSIS — R29810 Facial weakness: Secondary | ICD-10-CM | POA: Diagnosis present

## 2023-12-26 DIAGNOSIS — T82856A Stenosis of peripheral vascular stent, initial encounter: Secondary | ICD-10-CM

## 2023-12-26 DIAGNOSIS — I63232 Cerebral infarction due to unspecified occlusion or stenosis of left carotid arteries: Secondary | ICD-10-CM | POA: Diagnosis present

## 2023-12-26 DIAGNOSIS — I63032 Cerebral infarction due to thrombosis of left carotid artery: Secondary | ICD-10-CM

## 2023-12-26 DIAGNOSIS — I639 Cerebral infarction, unspecified: Secondary | ICD-10-CM | POA: Diagnosis present

## 2023-12-26 DIAGNOSIS — R29703 NIHSS score 3: Secondary | ICD-10-CM | POA: Diagnosis present

## 2023-12-26 DIAGNOSIS — Z794 Long term (current) use of insulin: Secondary | ICD-10-CM | POA: Diagnosis not present

## 2023-12-26 DIAGNOSIS — G8191 Hemiplegia, unspecified affecting right dominant side: Secondary | ICD-10-CM | POA: Diagnosis present

## 2023-12-26 DIAGNOSIS — I10 Essential (primary) hypertension: Secondary | ICD-10-CM | POA: Diagnosis present

## 2023-12-26 DIAGNOSIS — D72829 Elevated white blood cell count, unspecified: Secondary | ICD-10-CM | POA: Diagnosis present

## 2023-12-26 DIAGNOSIS — Z7902 Long term (current) use of antithrombotics/antiplatelets: Secondary | ICD-10-CM | POA: Diagnosis not present

## 2023-12-26 DIAGNOSIS — R4701 Aphasia: Secondary | ICD-10-CM | POA: Diagnosis present

## 2023-12-26 DIAGNOSIS — F419 Anxiety disorder, unspecified: Secondary | ICD-10-CM | POA: Diagnosis present

## 2023-12-26 DIAGNOSIS — Z7982 Long term (current) use of aspirin: Secondary | ICD-10-CM | POA: Diagnosis not present

## 2023-12-26 DIAGNOSIS — E119 Type 2 diabetes mellitus without complications: Secondary | ICD-10-CM | POA: Diagnosis present

## 2023-12-26 DIAGNOSIS — E7800 Pure hypercholesterolemia, unspecified: Secondary | ICD-10-CM | POA: Diagnosis present

## 2023-12-26 DIAGNOSIS — R531 Weakness: Secondary | ICD-10-CM | POA: Diagnosis not present

## 2023-12-26 DIAGNOSIS — Z7984 Long term (current) use of oral hypoglycemic drugs: Secondary | ICD-10-CM | POA: Diagnosis not present

## 2023-12-26 LAB — CBG MONITORING, ED
Glucose-Capillary: 130 mg/dL — ABNORMAL HIGH (ref 70–99)
Glucose-Capillary: 144 mg/dL — ABNORMAL HIGH (ref 70–99)

## 2023-12-26 LAB — GLUCOSE, CAPILLARY
Glucose-Capillary: 115 mg/dL — ABNORMAL HIGH (ref 70–99)
Glucose-Capillary: 111 mg/dL — ABNORMAL HIGH (ref 70–99)

## 2023-12-26 LAB — COMPREHENSIVE METABOLIC PANEL WITH GFR
ALT: 39 U/L (ref 0–44)
AST: 23 U/L (ref 15–41)
Albumin: 4.4 g/dL (ref 3.5–5.0)
Alkaline Phosphatase: 47 U/L (ref 38–126)
Anion gap: 13 (ref 5–15)
BUN: 16 mg/dL (ref 8–23)
CO2: 24 mmol/L (ref 22–32)
Calcium: 9.4 mg/dL (ref 8.9–10.3)
Chloride: 103 mmol/L (ref 98–111)
Creatinine, Ser: 0.76 mg/dL (ref 0.61–1.24)
GFR, Estimated: >60 mL/min
Glucose, Bld: 117 mg/dL — ABNORMAL HIGH (ref 70–99)
Potassium: 3.9 mmol/L (ref 3.5–5.1)
Sodium: 140 mmol/L (ref 135–145)
Total Bilirubin: 0.4 mg/dL (ref 0.0–1.2)
Total Protein: 7.2 g/dL (ref 6.5–8.1)

## 2023-12-26 LAB — CBC
HCT: 41.1 % (ref 39.0–52.0)
Hemoglobin: 13.8 g/dL (ref 13.0–17.0)
MCH: 30.2 pg (ref 26.0–34.0)
MCHC: 33.6 g/dL (ref 30.0–36.0)
MCV: 89.9 fL (ref 80.0–100.0)
Platelets: 268 K/uL (ref 150–400)
RBC: 4.57 MIL/uL (ref 4.22–5.81)
RDW: 13.2 % (ref 11.5–15.5)
WBC: 9 K/uL (ref 4.0–10.5)
nRBC: 0 % (ref 0.0–0.2)

## 2023-12-26 LAB — LIPID PANEL
Cholesterol: 108 mg/dL (ref 0–200)
HDL: 33 mg/dL — ABNORMAL LOW
LDL Cholesterol: 50 mg/dL (ref 0–99)
Total CHOL/HDL Ratio: 3.3 ratio
Triglycerides: 124 mg/dL
VLDL: 25 mg/dL (ref 0–40)

## 2023-12-26 LAB — HIV ANTIBODY (ROUTINE TESTING W REFLEX): HIV Screen 4th Generation wRfx: NONREACTIVE

## 2023-12-26 LAB — HEPARIN LEVEL (UNFRACTIONATED): Heparin Unfractionated: 0.19 [IU]/mL — ABNORMAL LOW (ref 0.30–0.70)

## 2023-12-26 MED ORDER — HEPARIN (PORCINE) 25000 UT/250ML-% IV SOLN
1200.0000 [IU]/h | INTRAVENOUS | Status: DC
  Administered 2023-12-26: 1000 [IU]/h via INTRAVENOUS
  Filled 2023-12-26: qty 250

## 2023-12-26 MED ORDER — INSULIN GLARGINE 100 UNIT/ML ~~LOC~~ SOLN
10.0000 [IU] | Freq: Every day | SUBCUTANEOUS | Status: DC
  Administered 2023-12-26 – 2023-12-27 (×2): 10 [IU] via SUBCUTANEOUS
  Filled 2023-12-26 (×2): qty 0.1

## 2023-12-26 NOTE — ED Notes (Addendum)
 SABRA

## 2023-12-26 NOTE — Evaluation (Signed)
 Occupational Therapy Evaluation Patient Details Name: Jimmy Callahan MRN: [id number] DOB: 1955/11/18 Today's Date: 12/26/2023   History of Present Illness   Pt is a 68 year old man who presented to [facility] on 12/25/23 with R side weakness and word finding deficits. 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 PMH: recent TCAR 12/05/23, HTN, HLD, DM, anxiety, CAD, PVD.     Clinical Impressions Pt was independent prior to admission. He drives, is retired and lives with his girlfriend who does not drive. Pt presents with slight R shoulder strength deficits, impaired R fine motor coordination, + R UE drift, R inattention with ambulation and decreased awareness of deficits. Difficult to accurately assess cognition as there appears to be a language component when pt is responding to orientation questions and when asked his address. Pt requires up to min assist for ADLs and min assist for ambulation without AD within his room and bathroom. Patient will benefit from intensive inpatient follow-up therapy, >3 hours/day.     If plan is discharge home, recommend the following:   A little help with walking and/or transfers;A little help with bathing/dressing/bathroom;Assistance with cooking/housework;Assistance with feeding;Direct supervision/assist for medications management;Direct supervision/assist for financial management;Assist for transportation;Help with stairs or ramp for entrance     Functional Status Assessment   Patient has had a recent decline in their functional status and demonstrates the ability to make significant improvements in function in a reasonable and predictable amount of time.     Equipment Recommendations   None recommended by OT     Recommendations for Other Services         Precautions/Restrictions   Precautions Precautions: Fall     Mobility Bed Mobility Overal bed mobility: Needs Assistance Bed Mobility: Supine to Sit, Sit to Supine     Supine to  sit: Min assist, HOB elevated Sit to supine: Supervision   General bed mobility comments: assist to raise trunk with supine to sit at R side of stretcher, appears to have difficulty motor planning    Transfers Overall transfer level: Needs assistance Equipment used: None Transfers: Sit to/from Stand Sit to Stand: Supervision                  Balance Overall balance assessment: Needs assistance   Sitting balance-Leahy Scale: Good       Standing balance-Leahy Scale: Fair                             ADL either performed or assessed with clinical judgement   ADL Overall ADL's : Needs assistance/impaired Eating/Feeding: Minimal assistance;Bed level Eating/Feeding Details (indicate cue type and reason): assist to open containers Grooming: Wash/dry hands;Standing;Supervision/safety   Upper Body Bathing: Minimal assistance;Sitting   Lower Body Bathing: Minimal assistance;Sit to/from stand   Upper Body Dressing : Minimal assistance;Sitting Upper Body Dressing Details (indicate cue type and reason): front opening gown Lower Body Dressing: Sitting/lateral leans;Set up; Minimal assist;Sit to/from stand   Toilet Transfer: Contact guard assist;Ambulation   Toileting- Clothing Manipulation and Hygiene: Supervision/safety       Functional mobility during ADLs: Min assist, assist to avoid running into objects on R       Vision Baseline Vision/History: 1 Wears glasses Ability to See in Adequate Light: 0 Adequate Patient Visual Report: No change from baseline       Perception Perception: Impaired Preception Impairment Details: Inattention/Neglect Perception-Other Comments: R inattention with difficulty navigating around objects with walking  Praxis         Pertinent Vitals/Pain Pain Assessment Pain Assessment: No/denies pain     Extremity/Trunk Assessment Upper Extremity Assessment Upper Extremity Assessment: Right hand dominant;RUE  deficits/detail RUE Deficits / Details: 4+/5 shoulder, 5/5 elbow to gross grasp RUE Sensation: decreased proprioception (+ drift, postures R UE in flexion without apparent awareness) RUE Coordination: decreased fine motor   Lower Extremity Assessment Lower Extremity Assessment: Defer to PT evaluation   Cervical / Trunk Assessment Cervical / Trunk Assessment: Normal   Communication Communication Communication: Impaired Factors Affecting Communication: Hearing impaired   Cognition Arousal: Alert Behavior During Therapy: WFL for tasks assessed/performed Cognition: Cognition impaired   Orientation impairments: Time Awareness: Online awareness impaired, Intellectual awareness impaired Memory impairment (select all impairments): Short-term memory, Declarative long-term memory, Working Biochemist, Clinical functioning impairment (select all impairments): Problem solving OT - Cognition Comments: Difficulty accurately assessing cognition due to possible language deficits vs hearing. Pt with intermittent dancing and singing.                 Following commands: Intact       Cueing  General Comments          Exercises     Shoulder Instructions      Home Living Family/patient expects to be discharged to:: Private residence Living Arrangements: Spouse/significant other Available Help at Discharge: Family;Available 24 hours/day Type of Home: Apartment Home Access: Level entry     Home Layout: One level     Bathroom Shower/Tub: Chief Strategy Officer: Standard     Home Equipment: None          Prior Functioning/Environment Prior Level of Function : Independent/Modified Independent;Driving                    OT Problem List: Decreased cognition;Decreased safety awareness;Decreased coordination;Impaired vision/perception;Impaired UE functional use   OT Treatment/Interventions: Self-care/ADL training;DME and/or AE instruction;Therapeutic  activities;Visual/perceptual remediation/compensation;Cognitive remediation/compensation;Patient/family education;Balance training      OT Goals(Current goals can be found in the care plan section)   Acute Rehab OT Goals OT Goal Formulation: With patient Time For Goal Achievement: 01/09/24 Potential to Achieve Goals: Good ADL Goals Pt Will Transfer to Toilet: Independently;ambulating;regular height toilet Additional ADL Goal #1: Pt will complete basic ADLs modified independently. Additional ADL Goal #2: Pt and girlfriend will be aware of pt's deficits and safety considerations.   OT Frequency:  Min 2X/week    Co-evaluation              AM-PAC OT 6 Clicks Daily Activity     Outcome Measure Help from another person eating meals?: A Little Help from another person taking care of personal grooming?: A Little Help from another person toileting, which includes using toliet, bedpan, or urinal?: A Little Help from another person bathing (including washing, rinsing, drying)?: A Little Help from another person to put on and taking off regular upper body clothing?: A Little Help from another person to put on and taking off regular lower body clothing?: A Little 6 Click Score: 18   End of Session Equipment Utilized During Treatment: Gait belt  Activity Tolerance: Patient tolerated treatment well Patient left: in bed;with call bell/phone within reach  OT Visit Diagnosis: Unsteadiness on feet (R26.81);Other abnormalities of gait and mobility (R26.89);Hemiplegia and hemiparesis;Other symptoms and signs involving cognitive function;Cognitive communication deficit (R41.841) Symptoms and signs involving cognitive functions: Cerebral infarction Hemiplegia - Right/Left: Right Hemiplegia - dominant/non-dominant: Dominant Hemiplegia - caused by:  Cerebral infarction                Time: 9145-9066 OT Time Calculation (min): 39 min Charges:  OT General Charges $OT Visit: 1 Visit OT  Evaluation $OT Eval Moderate Complexity: 1 Mod OT Treatments $Self Care/Home Management : 23-37 mins  Mliss HERO, OTR/L Acute Rehabilitation Services Office: [phone]  Kennth Mliss Helling 12/26/2023, 9:50 AM

## 2023-12-26 NOTE — Progress Notes (Signed)
 PHARMACY - ANTICOAGULATION CONSULT NOTE  Pharmacy Consult for heparin    Indication: stroke, laminar thrombus within carotid stent   Allergies[1]  Patient Measurements: Height: 5' 10.98 (180.3 cm) Weight: 84.2 kg (185 lb 10 oz) IBW/kg (Calculated) : 75.26 HEPARIN  DW (KG): 84.2  Vital Signs: Temp: 98.4 F (36.9 C) (12/22 2014) Temp Source: Oral (12/22 2014) BP: 152/95 (12/22 2014) Pulse Rate: 75 (12/22 2014)  Labs: Recent Labs    12/25/23 1136 12/25/23 1140 12/26/23 0241 12/26/23 2124  HGB 14.3 14.6 13.8  --   HCT 41.9 43.0 41.1  --   PLT 289  --  268  --   APTT 30  --   --   --   LABPROT 13.8  --   --   --   INR 1.0  --   --   --   HEPARINUNFRC  --   --   --  0.19*  CREATININE 0.83 0.90 0.76  --     Estimated Creatinine Clearance: 94.1 mL/min (by C-G formula based on SCr of 0.76 mg/dL).   Medical History: Past Medical History:  Diagnosis Date   Carotid artery occlusion    Diabetes mellitus without complication (HCC)    Hyperlipidemia    Hypertension     Assessment: Patient presenting with CC of stroke like symptoms. 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 Patient not on anticoagulation PTA (plavix /ASA for stent). Pharmacy consulted to dose heparin .   Heparin  level 0.19 (subtherapeutic) on infusion at 1000 units/hr. No issues with line or bleeding reported per RN.  Goal of Therapy:  Heparin  level goal 0.3-0.5 units/ml Monitor platelets by anticoagulation protocol: Yes   Plan:  Increase heparin  infusion to 1150 units/hr F/u 6 hr heparin  level  Vito Ralph, PharmD, BCPS Please see amion for complete clinical pharmacist phone list 12/26/2023,10:26 PM       [1]  Allergies Allergen Reactions   Paroxetine Hcl     Other Reaction(s): nausea,lightheaded   Telmisartan-Hctz     Other Reaction(s): orthostatic/lightheadedness

## 2023-12-26 NOTE — Progress Notes (Signed)
 " PROGRESS NOTE    Jimmy Callahan  FMW:[id number] DOB: 11/22/1955 DOA: 12/25/2023 PCP: Jimmy Slater, MD   Brief Narrative:  Jimmy Callahan is a 68 y.o. male with medical history significant of hypertension, hyperlipidemia, diabetes, anxiety, carotid artery disease presented with right-sided weakness. Patient woke up at 9 AM on 12/25/2023 with right-sided weakness and maybe some word finding difficulty.  Last normal was 11 PM the night before.   Upon arrival to ED, hemodynamically stable. CT head showed no acute abnormality.  CTA head and neck with perfusion showed left carotid stenosis status post stent with only 20% residual stenosis, mild right carotid plaque, dominant right vertebral artery. Vascular surgery consulted with no additional intervention needed just to ensure patient is taking his antiplatelet regimen.  Neurology consulted, MRI ordered.  Assessment & Plan:   Principal Problem:   Stroke Jimmy Callahan) Active Problems:   Carotid stenosis, left   Anxiety disorder   Essential hypertension   Pure hypercholesterolemia   Type 2 diabetes mellitus with other specified complication (HCC)  Acute ischemic CVA, POA > CT head and CTA head and neck in the ED showed no acute abnormality.  Cyst and residual carotid disease but is status post into the left with only 20% residual stenosis. MRI brain shows Scattered patchy acute ischemic infarcts involving the cortical to subcortical aspect of the left cerebral hemisphere, watershed in distribution. Minimal associated petechial blood products without hemorrhagic transformation or significant regional mass effect. - Allow for permissive HTN  (systolic < 220 and diastolic < 120) - Continue home aspirin  and Plavix  for now  - LDL only 50 but HDL low at 33, continue home statin, await neurology recommendations - Echocardiogram recently done, will not repeat - Has recent A1C 6.8.  PT OT consulted. Per neurology, patient appears to have watershed  strokes on the same side where he recently had TCAR just 3 weeks ago.  Per neurology, they are suspecting thromboembolism at that site and recommended vascular surgery consult.  I have consulted Jimmy Callahan of vascular surgery.   Hypertension PTA medications include amlodipine  and valsartan holding antihypertensives in the setting as above   Hyperlipidemia - Continue home atorvastatin    Anxiety - Continue Lexapro    Diabetes melitis type II > 16 units long-acting daily at home along with metformin  and Jardiance . Currently started on 5 units of Lantus , will increase that to 10 units, continue SSI.  DVT prophylaxis: SCD's Start: 12/25/23 1358   Code Status: Full Code  Family Communication:  None present at bedside.  Plan of care discussed with patient in length and he/she verbalized understanding and agreed with it.  Status is: Observation The patient will require care spanning > 2 midnights and should be moved to inpatient because: Needs evaluation by PT OT as well as vascular surgery   Estimated body mass index is 28.8 kg/m as calculated from the following:   Height as of this encounter: 5' 9 (1.753 m).   Weight as of this encounter: 88.5 kg.    Nutritional Assessment: Body mass index is 28.8 kg/m.Jimmy Callahan Seen by dietician.  I agree with the assessment and plan as outlined below: Nutrition Status:        . Skin Assessment: I have examined the patient's skin and I agree with the wound assessment as performed by the wound care RN as outlined below:    Consultants:  Neurology and vascular surgery  Procedures:  As above  Antimicrobials:  Anti-infectives (From admission, onward)    None  Subjective: Patient seen and examined.  He says that his right arm weakness has improved and he is back to baseline.  He did not have any other complaint.  Objective: Vitals:   12/26/23 0500 12/26/23 0530 12/26/23 0600 12/26/23 0745  BP: (!) 123/91 (!) 136/102 (!) 152/97    Pulse: 67 82 84 78  Resp: 17 13 11 13   Temp:    98.2 F (36.8 C)  TempSrc:    Oral  SpO2: 98% 100% 100% 99%  Weight:      Height:        Intake/Output Summary (Last 24 hours) at 12/26/2023 0758 Last data filed at 12/26/2023 0119 Gross per 24 hour  Intake 241.83 ml  Output 400 ml  Net -158.17 ml   Filed Weights   12/25/23 1121  Weight: 88.5 kg    Examination:  General exam: Appears calm and comfortable  Respiratory system: Clear to auscultation. Respiratory effort normal. Cardiovascular system: S1 & S2 heard, RRR. No JVD, murmurs, rubs, gallops or clicks. No pedal edema. Gastrointestinal system: Abdomen is nondistended, soft and nontender. No organomegaly or masses felt. Normal bowel sounds heard. Central nervous system: Alert and oriented. No focal neurological deficits. Extremities: Symmetric 5 x 5 power. Skin: No rashes, lesions or ulcers Psychiatry: Judgement and insight appear normal. Mood & affect appropriate.    Data Reviewed: I have personally reviewed following labs and imaging studies  CBC: Recent Labs  Lab 12/25/23 1136 12/25/23 1140 12/26/23 0241  WBC 11.5*  --  9.0  NEUTROABS 8.8*  --   --   HGB 14.3 14.6 13.8  HCT 41.9 43.0 41.1  MCV 88.4  --  89.9  PLT 289  --  268   Basic Metabolic Panel: Recent Labs  Lab 12/25/23 1136 12/25/23 1140 12/26/23 0241  NA 136 139 140  K 4.1 4.1 3.9  CL 102 105 103  CO2 20*  --  24  GLUCOSE 133* 139* 117*  BUN 17 18 16   CREATININE 0.83 0.90 0.76  CALCIUM  9.7  --  9.4   GFR: Estimated Creatinine Clearance: 97.3 mL/min (by C-G formula based on SCr of 0.76 mg/dL). Liver Function Tests: Recent Labs  Lab 12/25/23 1136 12/26/23 0241  AST 28 23  ALT 47* 39  ALKPHOS 51 47  BILITOT 0.3 0.4  PROT 7.6 7.2  ALBUMIN 4.7 4.4   No results for input(s): LIPASE, AMYLASE in the last 168 hours. No results for input(s): AMMONIA in the last 168 hours. Coagulation Profile: Recent Labs  Lab 12/25/23 1136   INR 1.0   Cardiac Enzymes: No results for input(s): CKTOTAL, CKMB, CKMBINDEX, TROPONINI in the last 168 hours. BNP (last 3 results) No results for input(s): PROBNP in the last 8760 hours. HbA1C: No results for input(s): HGBA1C in the last 72 hours. CBG: Recent Labs  Lab 12/25/23 1113 12/25/23 1814 12/25/23 2227 12/26/23 0741  GLUCAP 132* 165* 120* 130*   Lipid Profile: Recent Labs    12/26/23 0241  CHOL 108  HDL 33*  LDLCALC 50  TRIG 875  CHOLHDL 3.3   Thyroid Function Tests: No results for input(s): TSH, T4TOTAL, FREET4, T3FREE, THYROIDAB in the last 72 hours. Anemia Panel: No results for input(s): VITAMINB12, FOLATE, FERRITIN, TIBC, IRON, RETICCTPCT in the last 72 hours. Sepsis Labs: No results for input(s): PROCALCITON, LATICACIDVEN in the last 168 hours.  No results found for this or any previous visit (from the past 240 hours).   Radiology Studies: MR BRAIN WO CONTRAST Result  Date: 12/25/2023 CLINICAL DATA:  Initial evaluation for acute neuro deficit, stroke suspected. EXAM: MRI HEAD WITHOUT CONTRAST TECHNIQUE: Multiplanar, multiecho pulse sequences of the brain and surrounding structures were obtained without intravenous contrast. COMPARISON:  CT from earlier the same day. FINDINGS: Brain: Cerebral volume within normal limits. Patchy T2/FLAIR hyperintensity involving the periventricular and deep white matter both cerebral hemispheres, consistent with chronic small vessel ischemic disease, mild-to-moderate in nature. Scattered patchy restricted diffusion seen involving the cortical to subcortical aspect of the left cerebral hemisphere, with involving the left frontal, parietal, and occipital lobes, with minimal temporal lobe involvement. Findings consistent with small acute ischemic infarcts, watershed in distribution. Minimal associated petechial blood products without hemorrhagic transformation or significant regional mass effect.  Gray-white matter differentiation otherwise maintained. No other acute or chronic intracranial hemorrhage. No mass lesion or midline shift. No hydrocephalus or extra-axial fluid collection. Pituitary gland within normal limits. Vascular: Major intracranial vascular flow voids are maintained. Skull and upper cervical spine: Craniocervical junction within limits. Bone marrow signal intensity normal. No scalp soft tissue abnormality. Sinuses/Orbits: Prior bilateral ocular lens replacement. Paranasal sinuses are clear. Small right mastoid effusion noted, of doubtful significance. Other: None. IMPRESSION: 1. Scattered patchy acute ischemic infarcts involving the cortical to subcortical aspect of the left cerebral hemisphere, watershed in distribution. Minimal associated petechial blood products without hemorrhagic transformation or significant regional mass effect. 2. Underlying mild-to-moderate chronic microvascular ischemic disease. Electronically Signed   By: Morene Hoard M.D.   On: 12/25/2023 19:26   CT ANGIO HEAD NECK W WO CM W PERF (CODE STROKE) Result Date: 12/25/2023 EXAM: CTA Head and Neck with Perfusion 12/25/2023 11:47:46 AM TECHNIQUE: CTA of the head and neck was performed without and with the administration of 100 mL of iohexol  (OMNIPAQUE ) 350 MG/ML injection. 3D postprocessing with multiplanar reconstructions and MIPs was performed to evaluate the vascular anatomy. Cerebral perfusion analysis using computed tomography with contrast administration, including post-processing of parametric maps with determination of cerebral blood flow, cerebral blood volume, mean transit time and time-to-maximum. Automated exposure control, iterative reconstruction, and/or weight based adjustment of the mA/kV was utilized to reduce the radiation dose to as low as reasonably achievable. COMPARISON: CT of the head dated 12/25/2023 and a CT angiogram of the head and neck dated 11/17/2023. CLINICAL HISTORY: Neuro  deficit, acute, stroke suspected. FINDINGS: CTA NECK: AORTIC ARCH AND ARCH VESSELS: No dissection or arterial injury. There is common origin of the brachiocephalic artery and left common carotid artery from the aortic arch. The arch demonstrates mild calcific atheromatous disease. No significant stenosis of the brachiocephalic or subclavian arteries. CERVICAL CAROTID ARTERIES: No dissection or arterial injury. Since the previous CT angiogram, there has been stenting of the cervical segment of the left internal carotid artery. The stent is patent with mild residual stenosis, less than 20%. There is mild calcific plaque present within the proximal right internal carotid artery, with no associated stenosis. No hemodynamically significant stenosis by NASCET criteria. CERVICAL VERTEBRAL ARTERIES: No dissection or arterial injury. The right vertebral artery is dominant and the left is mildly hypoplastic. No significant stenosis. LUNGS AND MEDIASTINUM: Unremarkable. SOFT TISSUES: No acute abnormality. BONES: No acute abnormality. CTA HEAD: ANTERIOR CIRCULATION: No significant stenosis of the intracranial internal carotid arteries. No significant stenosis of the anterior cerebral arteries. No significant stenosis of the middle cerebral arteries. No aneurysm. POSTERIOR CIRCULATION: There is fetal origin of the right posterior cerebral artery. No significant stenosis of the posterior cerebral arteries. No significant stenosis of the basilar  artery. No significant stenosis of the vertebral arteries. No aneurysm. OTHER: No dural venous sinus thrombosis on this non-dedicated study. CT PERFUSION: EXAM QUALITY: Exam quality is adequate with diagnostic perfusion maps. No significant motion artifact. Appropriate arterial inflow and venous outflow curves. CORE INFARCT (CBF<30% volume): 0 mL TOTAL HYPOPERFUSION (Tmax>6s volume): 0 mL PENUMBRA: Mismatch volume: 0 mL Mismatch ratio: not applicable Location: not applicable Please note in  the report that the above findings were communicated to Doctor Eulah Dover at 11:56 AM 12/25/2023. IMPRESSION: 1. Patent stent in the cervical segment of the left internal carotid artery with mild residual stenosis, less than 20%. 2. Mild calcific plaque in the proximal right internal carotid artery without associated stenosis. 3. Dominant right vertebral artery and mildly hypoplastic left vertebral artery; otherwise unremarkable vertebrobasilar system. 4. Fetal origin of the right posterior cerebral artery. 5. Common origin of the brachiocephalic artery and left common carotid artery from the aortic arch. 6. 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 7. Findings communicated to Dr. Michaela at 11:56 AM on 12/25/23. Electronically signed by: Evalene Coho MD 12/25/2023 12:03 PM EST RP Workstation: [id number]   CT HEAD CODE STROKE WO CONTRAST Result Date: 12/25/2023 EXAM: CT HEAD WITHOUT CONTRAST 12/25/2023 11:30:39 AM TECHNIQUE: CT of the head was performed without the administration of intravenous contrast. Automated exposure control, iterative reconstruction, and/or weight based adjustment of the mA/kV was utilized to reduce the radiation dose to as low as reasonably achievable. COMPARISON: CT head without contrast 11/17/2023. Periventricular and subcortical white matter hypoattenuation is moderately advanced for age. No acute or focal cortical abnormalities are present. CLINICAL HISTORY: Neuro deficit, acute, stroke suspected. Right-sided weakness. FINDINGS: BRAIN AND VENTRICLES: No acute hemorrhage. No evidence of acute infarct. No hydrocephalus. No extra-axial collection. No mass effect or midline shift. Periventricular and subcortical white matter hypoattenuation is moderately advanced for age. No acute or focal cortical abnormalities are present. [place] Stroke Program Early CT Score (ASPECTS) ----- Ganglionic (caudate, ic, lentiform nucleus, insula, M1-m3): 7 Supraganglionic (m4-m6): 3  Total: 10 ORBITS: No acute abnormality. SINUSES: No acute abnormality. SOFT TISSUES AND SKULL: No acute soft tissue abnormality. No skull fracture. IMPRESSION: 1. No acute intracranial abnormality. ASPECTS score 10. 2. Moderately advanced periventricular and subcortical white matter hypoattenuation for age. 3. these results were communicated to Dr. Michaela at 11:35 AM on 12/25/2023 by secure text page via the [facility] messaging system. Electronically signed by: Lonni Necessary MD 12/25/2023 11:35 AM EST RP Workstation: [id number]    Scheduled Meds:   stroke: early stages of recovery book   Does not apply Once   aspirin  EC  81 mg Oral Daily   atorvastatin   80 mg Oral Daily   clopidogrel   75 mg Oral Daily   escitalopram   5 mg Oral Daily   insulin  aspart  0-15 Units Subcutaneous TID WC   insulin  glargine  5 Units Subcutaneous Daily   Continuous Infusions:  sodium chloride  40 mL/hr at 12/26/23 0119     LOS: 0 days   Fredia Skeeter, MD [facility]  12/26/2023, 7:58 AM   *Please note that this is a verbal dictation therefore any spelling or grammatical errors are due to the Dragon Medical One system interpretation.  Please page via Amion and do not message via secure chat for urgent patient care matters. Secure chat can be used for non urgent patient care matters.  How to contact the [facility] Attending or Consulting provider 7A - 7P or covering provider during after hours 7P -7A,  for this patient?  Check the care team in [facility] and look for a) attending/consulting [facility] provider listed and b) the [facility] team listed. Page or secure chat 7A-7P. Log into [email or web address] and use [place]'s universal password to access. If you do not have the password, please contact the Callahan operator. Locate the [facility] provider you are looking for under [facility] and page to a number that you can be directly reached. If you still have difficulty reaching the provider, please page the [facility] (Director on  Call) for the Hospitalists listed on amion for assistance.  "

## 2023-12-26 NOTE — Consult Note (Addendum)
 " Progress Note    12/26/2023 11:30 AM   HPI:  in the ED with his wife.  His wife states he had another stroke and now in the hospital.  He has not had any further amaurosis fugax.  His wife states that he has a deficit with the right leg when walking and she also states he has had trouble finding the words he wants to say.  He states he is improved.    He has been compliant with his plavix , statin and aspirin  and not missed any doses.    Vitals:   12/26/23 0830 12/26/23 1126  BP: (!) 151/101 (!) 153/96  Pulse: 92 86  Resp: 19 20  Temp:  98.1 F (36.7 C)  SpO2: 100% 93%    Physical Exam: General:  no distress Lungs:  non labored Incisions:  healing nicely. Neuro:  moving all extremities equally and tongue is midline.    CBC    Component Value Date/Time   WBC 9.0 12/26/2023 0241   RBC 4.57 12/26/2023 0241   HGB 13.8 12/26/2023 0241   HCT 41.1 12/26/2023 0241   PLT 268 12/26/2023 0241   MCV 89.9 12/26/2023 0241   MCH 30.2 12/26/2023 0241   MCHC 33.6 12/26/2023 0241   RDW 13.2 12/26/2023 0241   LYMPHSABS 1.7 12/25/2023 1136   MONOABS 0.9 12/25/2023 1136   EOSABS 0.1 12/25/2023 1136   BASOSABS 0.1 12/25/2023 1136    BMET    Component Value Date/Time   NA 140 12/26/2023 0241   K 3.9 12/26/2023 0241   CL 103 12/26/2023 0241   CO2 24 12/26/2023 0241   GLUCOSE 117 (H) 12/26/2023 0241   BUN 16 12/26/2023 0241   CREATININE 0.76 12/26/2023 0241   CALCIUM  9.4 12/26/2023 0241   GFRNONAA >60 12/26/2023 0241   GFRAA 85 (L) 02/04/2014 0927    INR    Component Value Date/Time   INR 1.0 12/25/2023 1136     Intake/Output Summary (Last 24 hours) at 12/26/2023 1130 Last data filed at 12/26/2023 9060 Gross per 24 hour  Intake 569.74 ml  Output 400 ml  Net 169.74 ml      Assessment/Plan:  68 y.o. male is s/p:  Left TCAR on 12/06/2023 for symptomatic (amaurosis fugax left eye) carotid artery stenosis by Dr. Lanis     -pt readmitted with right sided weakness  and expressive aphasia.  CTA head/neck reveal the carotid stent is patent with mild residual stenosis of less than 20%.  Right ICA with mild calcific plaque without stenosis.  He has been compliant with his asa/statin/plavix  and not missed any doses.  His incision is healing nicely.   -Dr. Gretta will be by to evaluate pt later today.    Lucie Apt, PA-C [facility] [phone] 12/26/2023 11:30 AM  I have seen and evaluated the patient. I agree with the PA note as documented above.  68 year old male that underwent left TCAR on 12/05/2023 for a symptomatic high-grade stenosis with soft atheroma by Dr. Lanis.  Was doing well at home and then yesterday developed right arm and leg weakness with evidence of new stroke with MRI showing left brain infarct on the side the carotid stent was placed.  There is laminar thrombus inside the stent that I suspect is related to the soft atheroma from the initial lesion.  Suspicious this was the etiology for his left brain stroke otherwise the stent is patent.  Would recommend heparin  after discussion with neurology to hopefully melt  away the thrombus in addition to Plavix .  Will hold his aspirin  to avoid triple therapy.  Hopefully can be transition to Plavix  DOAC at discharge with interval imaging as an outpatient.  Lonni DOROTHA Gaskins, MD [facility] [place] Office: [phone]   "

## 2023-12-26 NOTE — Progress Notes (Addendum)
 PHARMACY - ANTICOAGULATION CONSULT NOTE  Pharmacy Consult for heparin    Indication: stroke, laminar thrombus within carotid stent   Allergies[1]  Patient Measurements: Height: 5' 9 (175.3 cm) Weight: 88.5 kg (195 lb) IBW/kg (Calculated) : 70.7 HEPARIN  DW (KG): 88.4  Vital Signs: Temp: 98.1 F (36.7 C) (12/22 1126) Temp Source: Oral (12/22 1126) BP: 153/96 (12/22 1126) Pulse Rate: 86 (12/22 1126)  Labs: Recent Labs    12/25/23 1136 12/25/23 1140 12/26/23 0241  HGB 14.3 14.6 13.8  HCT 41.9 43.0 41.1  PLT 289  --  268  APTT 30  --   --   LABPROT 13.8  --   --   INR 1.0  --   --   CREATININE 0.83 0.90 0.76    Estimated Creatinine Clearance: 97.3 mL/min (by C-G formula based on SCr of 0.76 mg/dL).   Medical History: Past Medical History:  Diagnosis Date   Carotid artery occlusion    Diabetes mellitus without complication (HCC)    Hyperlipidemia    Hypertension     Assessment: Patient presenting with CC of stroke like symptoms. 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 Patient not on anticoagulation PTA (plavix /ASA for stent).   HgB 13.8 and PLTs 268. Pharmacy consulted to dose heparin .    Goal of Therapy:  Heparin  level goal 0.3-0.5  Monitor platelets by anticoagulation protocol: Yes   Plan:  No bolus given recent acute infarct.  Start heparin  infusion at 1000 units/hr Check anti-Xa level in 8 hours and daily while on heparin  Continue to monitor H&H and platelets Plan to discharge on plavix  + DOAC.  Eliquis  and Xarelto co-pay $0.00    Powell Blush, PharmD, BCCCP  12/26/2023,12:12 PM      [1]  Allergies Allergen Reactions   Paroxetine Hcl     Other Reaction(s): nausea,lightheaded   Telmisartan-Hctz     Other Reaction(s): orthostatic/lightheadedness

## 2023-12-26 NOTE — Telephone Encounter (Signed)
 Pharmacy Patient Advocate Encounter  Insurance verification completed.    The patient is insured through [employer]/ [employer]. Patient has Medicare and is not eligible for a copay card, but may be able to apply for patient assistance or Medicare RX Payment Plan (Patient Must reach out to their plan, if eligible for payment plan), if available.    Ran test claim for Eliquis  5mg  tablet and the current 30 day co-pay is $0.  Ran test claim for Xarelto 20mg  tablet and the current 30 day co-pay is $0.  This test claim was processed through [employer]- copay amounts may vary at other pharmacies due to [employer], or as the patient moves through the different stages of their insurance plan.

## 2023-12-26 NOTE — ED Notes (Signed)
 ECHO at bedside.

## 2023-12-26 NOTE — Evaluation (Signed)
 Speech Language Pathology Evaluation Patient Details Name: Jimmy Callahan MRN: [id number] DOB: [birth year] Today's Date: 12/26/2023 Time: 8498-8488 SLP Time Calculation (min) (ACUTE ONLY): 10 min  Problem List:  Patient Active Problem List   Diagnosis Date Noted   Anxiety disorder 12/25/2023   Essential hypertension 12/25/2023   Pure hypercholesterolemia 12/25/2023   Type 2 diabetes mellitus with other specified complication (HCC) 12/25/2023   Stroke (HCC) 12/25/2023   Carotid stenosis, left 12/05/2023   Symptomatic carotid artery stenosis, left 12/05/2023   Past Medical History:  Past Medical History:  Diagnosis Date   Carotid artery occlusion    Diabetes mellitus without complication (HCC)    Hyperlipidemia    Hypertension    Past Surgical History:  Past Surgical History:  Procedure Laterality Date   HERNIA REPAIR Bilateral 1971   TRANSCAROTID ARTERY REVASCULARIZATION  Left 12/05/2023   Procedure: LEFT TRANSCAROTID ARTERY REVASCULARIZATION;  Surgeon: Lanis Fonda BRAVO, MD;  Location: [facility] OR;  Service: Vascular;  Laterality: Left;   ULTRASOUND GUIDANCE FOR VASCULAR ACCESS Right 12/05/2023   Procedure: ULTRASOUND GUIDANCE, FOR VASCULAR ACCESS;  Surgeon: Lanis Fonda BRAVO, MD;  Location: [facility] OR;  Service: Vascular;  Laterality: Right;   HPI:  Pt is a 68 year old man who presented to [facility] on 12/25/23 with R side weakness and word finding deficits. 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 PMH: recent TCAR 12/05/23, HTN, HLD, DM, anxiety, CAD, PVD   Assessment / Plan / Recommendation Clinical Impression  Pt is independent at home, where he lives with his significant other. He presents with acute deficits related to auditory comprehension, memory, problem solving, and reasoning evidenced by his performance on selected subsections of the Cognistat. Multistep commands, calculations, and verbal reasoning were exceptionally challenging. He recalled 2/4 novel words after a delay given  multiple choices. Instances of circumlocution were observed with spontaneous speech but repetition and naming appear intact in structured tasks. Session was limited by need for nursing care but SLP will f/u to target deficits listed above and for ongoing assessment of expressive/receptive language.     SLP Assessment  SLP Recommendation/Assessment: Patient needs continued Speech Language Pathology Services SLP Visit Diagnosis: Cognitive communication deficit (R41.841)     Assistance Recommended at Discharge  Frequent or constant Supervision/Assistance  Functional Status Assessment Patient has had a recent decline in their functional status and demonstrates the ability to make significant improvements in function in a reasonable and predictable amount of time.  Frequency and Duration min 2x/week  2 weeks      SLP Evaluation Cognition  Overall Cognitive Status: Impaired/Different from baseline Arousal/Alertness: Awake/alert Orientation Level: Oriented X4 Attention: Selective Selective Attention: Impaired Selective Attention Impairment: Verbal basic Memory: Impaired Memory Impairment: Retrieval deficit Awareness: Impaired Awareness Impairment: Emergent impairment Problem Solving: Impaired Problem Solving Impairment: Verbal basic Executive Function: Reasoning Reasoning: Impaired Reasoning Impairment: Verbal basic       Comprehension  Auditory Comprehension Overall Auditory Comprehension: Appears within functional limits for tasks assessed    Expression Expression Primary Mode of Expression: Verbal Verbal Expression Overall Verbal Expression: Appears within functional limits for tasks assessed   Oral / Motor  Oral Motor/Sensory Function Overall Oral Motor/Sensory Function: Within functional limits Motor Speech Overall Motor Speech: Appears within functional limits for tasks assessed            Damien Blumenthal, M.A., CCC-SLP Speech Language Pathology, Acute Rehabilitation  Services  Secure Chat preferred [phone]  12/26/2023, 3:34 PM

## 2023-12-26 NOTE — Progress Notes (Signed)
 STROKE TEAM PROGRESS NOTE   INTERIM HISTORY/SUBJECTIVE No acute events this morning.  OBJECTIVE  CBC    Component Value Date/Time   WBC 9.0 12/26/2023 0241   RBC 4.57 12/26/2023 0241   HGB 13.8 12/26/2023 0241   HCT 41.1 12/26/2023 0241   PLT 268 12/26/2023 0241   MCV 89.9 12/26/2023 0241   MCH 30.2 12/26/2023 0241   MCHC 33.6 12/26/2023 0241   RDW 13.2 12/26/2023 0241   LYMPHSABS 1.7 12/25/2023 1136   MONOABS 0.9 12/25/2023 1136   EOSABS 0.1 12/25/2023 1136   BASOSABS 0.1 12/25/2023 1136    BMET    Component Value Date/Time   NA 140 12/26/2023 0241   K 3.9 12/26/2023 0241   CL 103 12/26/2023 0241   CO2 24 12/26/2023 0241   GLUCOSE 117 (H) 12/26/2023 0241   BUN 16 12/26/2023 0241   CREATININE 0.76 12/26/2023 0241   CALCIUM  9.4 12/26/2023 0241   GFRNONAA >60 12/26/2023 0241    IMAGING past 24 hours MR BRAIN WO CONTRAST Result Date: 12/25/2023 CLINICAL DATA:  Initial evaluation for acute neuro deficit, stroke suspected. EXAM: MRI HEAD WITHOUT CONTRAST TECHNIQUE: Multiplanar, multiecho pulse sequences of the brain and surrounding structures were obtained without intravenous contrast. COMPARISON:  CT from earlier the same day. FINDINGS: Brain: Cerebral volume within normal limits. Patchy T2/FLAIR hyperintensity involving the periventricular and deep white matter both cerebral hemispheres, consistent with chronic small vessel ischemic disease, mild-to-moderate in nature. Scattered patchy restricted diffusion seen involving the cortical to subcortical aspect of the left cerebral hemisphere, with involving the left frontal, parietal, and occipital lobes, with minimal temporal lobe involvement. Findings consistent with small acute ischemic infarcts, watershed in distribution. Minimal associated petechial blood products without hemorrhagic transformation or significant regional mass effect. Gray-white matter differentiation otherwise maintained. No other acute or chronic intracranial  hemorrhage. No mass lesion or midline shift. No hydrocephalus or extra-axial fluid collection. Pituitary gland within normal limits. Vascular: Major intracranial vascular flow voids are maintained. Skull and upper cervical spine: Craniocervical junction within limits. Bone marrow signal intensity normal. No scalp soft tissue abnormality. Sinuses/Orbits: Prior bilateral ocular lens replacement. Paranasal sinuses are clear. Small right mastoid effusion noted, of doubtful significance. Other: None. IMPRESSION: 1. Scattered patchy acute ischemic infarcts involving the cortical to subcortical aspect of the left cerebral hemisphere, watershed in distribution. Minimal associated petechial blood products without hemorrhagic transformation or significant regional mass effect. 2. Underlying mild-to-moderate chronic microvascular ischemic disease. Electronically Signed   By: Morene Hoard M.D.   On: 12/25/2023 19:26    Vitals:   12/26/23 1126 12/26/23 1436 12/26/23 1520 12/26/23 1524  BP: (!) 153/96 (!) 145/94 135/87   Pulse: 86 86 84   Resp: 20 15 14    Temp: 98.1 F (36.7 C)  (!) 97.4 F (36.3 C)   TempSrc: Oral  Oral   SpO2: 93% (!) 83% 94%   Weight:    84.2 kg  Height:    5' 10.98 (1.803 m)     PHYSICAL EXAM General:  Alert, well-nourished, well-developed patient in no acute distress Psych:  Mood and affect appropriate for situation CV: Regular rate and rhythm on monitor Respiratory:  Regular, unlabored respirations on room air GI: Abdomen soft and nontender   NEURO:  Mental Status: AA&Ox3, patient is able to give clear and coherent history Speech/Language: speech is without dysarthria or aphasia.  Naming, repetition, fluency, and comprehension intact.  Cranial Nerves:  II: PERRL. Visual fields full.  III, IV, VI: EOMI. Eyelids  elevate symmetrically.  V: Sensation is intact to light touch and symmetrical to face.  VII: Face is symmetrical resting and smiling VIII: hearing intact to  voice. IX, X: Palate elevates symmetrically. Phonation is normal.  KP:Dynloizm shrug 5/5. XII: tongue is midline without fasciculations. Motor: 5/5 strength to all muscle groups tested.  Tone: is normal and bulk is normal Sensation- Intact to light touch bilaterally. Extinction absent to light touch to DSS.   Coordination: FTN intact bilaterally, HKS: no ataxia in BLE.No drift.  Gait- deferred     ASSESSMENT/PLAN  Jimmy Callahan is a 68 y.o. male with hx of L TCAR 2 weeks ago (DAPT at home), DM, HTN, HLD who was brought into the ED by family d/t right arm weakness and aphasia.  Symptoms later improved.  Vessel imaging showed potential left ICA thrombus within recently placed stent.  MRI brain showed left hemispheric punctate scattered strokes. After discussion with vascular surgery, decision was made with patient on anticoagulation in addition to Plavix  to prevent further strokes.  There is decreased risk of bleeding with AC and AP; however, benefits outweigh the risks in this scenario.  Patient agrees.  Patient remained stable on heparin  overnight, he may be transitioned to Eliquis  tomorrow.  Etiology: Large vessel disease/thrombus q4H neurocheck LDL 50, goal <70, continue home statin regimen HgbA1c 6.8, goal <7 VTE prophylaxis [facility] day # 0    To contact Stroke Continuity provider, please refer to [email or web address]. After hours, contact General Neurology

## 2023-12-26 NOTE — Evaluation (Signed)
 Physical Therapy Evaluation Patient Details Name: Jimmy Callahan MRN: [id number] DOB: [birth year] Today's Date: 12/26/2023  History of Present Illness  Pt is a 68 year old man who presented to [facility] on 12/25/23 with R side weakness and word finding deficits. 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 PMH: recent TCAR 12/05/23, HTN, HLD, DM, anxiety, CAD, PVD.  Clinical Impression  Pt presents to PT with decr mobility and safety due to rt sided weakness, rt inattention, decr awareness of deficits,  and decr safety awareness. Pt lives with significant other who can not provide physical assistance. The combination of decr awareness, rt inattention, and rt weakness make pt a high fall risk at this time. Patient will benefit from intensive inpatient follow-up therapy, >3 hours/day.        If plan is discharge home, recommend the following: A little help with walking and/or transfers;A little help with bathing/dressing/bathroom;Direct supervision/assist for medications management;Direct supervision/assist for financial management;Assistance with cooking/housework;Assist for transportation;Supervision due to cognitive status   Can travel by private vehicle        Equipment Recommendations Other (comment) (To be determined)  Recommendations for Other Services  Rehab consult    Functional Status Assessment Patient has had a recent decline in their functional status and demonstrates the ability to make significant improvements in function in a reasonable and predictable amount of time.     Precautions / Restrictions Precautions Precautions: Fall      Mobility  Bed Mobility Overal bed mobility: Needs Assistance Bed Mobility: Supine to Sit, Sit to Supine     Supine to sit: Min assist, HOB elevated Sit to supine: Supervision   General bed mobility comments: Assist to elevate trunk into sitting    Transfers Overall transfer level: Needs assistance Equipment used: None Transfers:  Sit to/from Stand Sit to Stand: Contact guard assist           General transfer comment: Assist for balance    Ambulation/Gait Ambulation/Gait assistance: Min assist, Contact guard assist Gait Distance (Feet): 150 Feet Assistive device: None, Rollator (4 wheels) Gait Pattern/deviations: Decreased step length - right, Decreased dorsiflexion - right, Drifts right/left Gait velocity: decr Gait velocity interpretation: 1.31 - 2.62 ft/sec, indicative of limited community ambulator   General Gait Details: Assist for balance. As distance incr rt foot tends to drag and pt needed incr assist. Decr attention to rt side resulting in pt bumping objects on rt. Tried rollator with pt having difficulty keeping rt hand on rollator due to inattention and weakness  Stairs            Wheelchair Mobility     Tilt Bed    Modified Rankin (Stroke Patients Only) Modified Rankin (Stroke Patients Only) Pre-Morbid Rankin Score: No symptoms Modified Rankin: Moderately severe disability     Balance Overall balance assessment: Needs assistance Sitting-balance support: No upper extremity supported, Feet supported Sitting balance-Leahy Scale: Good     Standing balance support: No upper extremity supported, During functional activity Standing balance-Leahy Scale: Fair                               Pertinent Vitals/Pain      Home Living Family/patient expects to be discharged to:: Private residence Living Arrangements: Spouse/significant other Available Help at Discharge: Family;Available 24 hours/day Type of Home: Apartment Home Access: Level entry       Home Layout: One level Home Equipment: None      Prior  Function Prior Level of Function : Independent/Modified Independent;Driving             Mobility Comments: Does not use assistive device       Extremity/Trunk Assessment   Upper Extremity Assessment Upper Extremity Assessment: Defer to OT evaluation RUE  Deficits / Details: 4+/5 shoulder, 5/5 elbow to gross grasp RUE Sensation: decreased proprioception (+ drift, postures R UE in flexion without apparent awareness) RUE Coordination: decreased fine motor    Lower Extremity Assessment Lower Extremity Assessment: RLE deficits/detail RLE Deficits / Details: functionally 4/5 in hip/knee and dorsiflexion 3/5 RLE Coordination: decreased gross motor    Cervical / Trunk Assessment Cervical / Trunk Assessment: Normal  Communication   Communication Communication: Impaired Factors Affecting Communication: Hearing impaired;Difficulty expressing self    Cognition Arousal: Alert Behavior During Therapy: WFL for tasks assessed/performed   PT - Cognitive impairments: Difficult to assess, Awareness, Problem solving, Safety/Judgement Difficult to assess due to: Impaired communication                     PT - Cognition Comments: Decr awareness of deficits and poor safety awareness Following commands: Intact       Cueing       General Comments      Exercises     Assessment/Plan    PT Assessment Patient needs continued PT services  PT Problem List Decreased strength;Decreased balance;Decreased mobility;Decreased cognition;Decreased coordination;Decreased safety awareness       PT Treatment Interventions DME instruction;Gait training;Stair training;Functional mobility training;Therapeutic activities;Therapeutic exercise;Balance training;Neuromuscular re-education;Cognitive remediation;Patient/family education    PT Goals (Current goals can be found in the Care Plan section)  Acute Rehab PT Goals Patient Stated Goal: not stated PT Goal Formulation: With patient Time For Goal Achievement: 01/09/24 Potential to Achieve Goals: Good    Frequency Min 3X/week     Co-evaluation               AM-PAC PT 6 Clicks Mobility  Outcome Measure Help needed turning from your back to your side while in a flat bed without using  bedrails?: None Help needed moving from lying on your back to sitting on the side of a flat bed without using bedrails?: A Little Help needed moving to and from a bed to a chair (including a wheelchair)?: A Little Help needed standing up from a chair using your arms (e.g., wheelchair or bedside chair)?: A Little Help needed to walk in hospital room?: A Little Help needed climbing 3-5 steps with a railing? : A Little 6 Click Score: 19    End of Session   Activity Tolerance: Patient tolerated treatment well Patient left: in bed;with call bell/phone within reach;with family/visitor present   PT Visit Diagnosis: Unsteadiness on feet (R26.81);Other abnormalities of gait and mobility (R26.89);Hemiplegia and hemiparesis Hemiplegia - Right/Left: Right Hemiplegia - dominant/non-dominant: Dominant Hemiplegia - caused by: Cerebral infarction    Time: 8964-8945 PT Time Calculation (min) (ACUTE ONLY): 19 min   Charges:   PT Evaluation $PT Eval Moderate Complexity: 1 Mod   PT General Charges $$ ACUTE PT VISIT: 1 Visit         [facility] PT Acute Rehabilitation Services Office [phone]   [facility] ORN [facility] 12/26/2023, 11:13 AM

## 2023-12-26 NOTE — Progress Notes (Signed)
  Inpatient Rehab Admissions Coordinator :  Per therapy recommendations, patient was screened for CIR candidacy by Ottie Glazier RN MSN.  At this time patient appears to be a potential candidate for CIR. I will place a rehab consult per protocol for full assessment. Please call me with any questions.  Ottie Glazier RN MSN Admissions Coordinator [phone]

## 2023-12-26 NOTE — Progress Notes (Signed)
" ° °  Brief Progress Note   _____________________________________________________________________________________________________________  Patient Name: Jimmy Callahan Patient DOB: 10-06-1955 Date: @TODAY @      Data: Reviewed labs, VS, notes.      Action: No action needed at this time.      Response:    _____________________________________________________________________________________________________________  [facility] RN Expeditor Sharolyn JONETTA Batman Please contact us  directly via secure chat (search for [facility]) or by calling us  at [phone] [address] [facility]).  "

## 2023-12-27 ENCOUNTER — Other Ambulatory Visit (HOSPITAL_COMMUNITY): Payer: Self-pay

## 2023-12-27 DIAGNOSIS — I639 Cerebral infarction, unspecified: Secondary | ICD-10-CM | POA: Diagnosis not present

## 2023-12-27 DIAGNOSIS — R531 Weakness: Principal | ICD-10-CM

## 2023-12-27 LAB — CBC
HCT: 41 % (ref 39.0–52.0)
Hemoglobin: 14.2 g/dL (ref 13.0–17.0)
MCH: 30.5 pg (ref 26.0–34.0)
MCHC: 34.6 g/dL (ref 30.0–36.0)
MCV: 88 fL (ref 80.0–100.0)
Platelets: 255 K/uL (ref 150–400)
RBC: 4.66 MIL/uL (ref 4.22–5.81)
RDW: 13 % (ref 11.5–15.5)
WBC: 7.6 K/uL (ref 4.0–10.5)
nRBC: 0 % (ref 0.0–0.2)

## 2023-12-27 LAB — GLUCOSE, CAPILLARY: Glucose-Capillary: 131 mg/dL — ABNORMAL HIGH (ref 70–99)

## 2023-12-27 LAB — HEPARIN LEVEL (UNFRACTIONATED): Heparin Unfractionated: 0.3 [IU]/mL (ref 0.30–0.70)

## 2023-12-27 MED ORDER — APIXABAN 5 MG PO TABS
5.0000 mg | ORAL_TABLET | Freq: Two times a day (BID) | ORAL | Status: DC
  Administered 2023-12-27: 5 mg via ORAL
  Filled 2023-12-27: qty 1

## 2023-12-27 MED ORDER — APIXABAN 5 MG PO TABS
5.0000 mg | ORAL_TABLET | Freq: Two times a day (BID) | ORAL | 0 refills | Status: AC
  Filled 2023-12-27: qty 180, 90d supply, fill #0

## 2023-12-27 MED ORDER — CLOPIDOGREL BISULFATE 75 MG PO TABS
75.0000 mg | ORAL_TABLET | Freq: Every day | ORAL | 0 refills | Status: AC
  Filled 2023-12-27 – 2024-01-16 (×2): qty 90, 90d supply, fill #0

## 2023-12-27 NOTE — Progress Notes (Incomplete)
 " PROGRESS NOTE    Jimmy Callahan  FMW:[id number] DOB: Jan 05, 1956 DOA: 12/25/2023 PCP: Regino Slater, MD   Brief Narrative:  Jimmy Callahan is a 68 y.o. male with medical history significant of hypertension, hyperlipidemia, diabetes, anxiety, carotid artery disease presented with right-sided weakness. Patient woke up at 9 AM on 12/25/2023 with right-sided weakness and maybe some word finding difficulty.  Last normal was 11 PM the night before.   Upon arrival to ED, hemodynamically stable. CT head showed no acute abnormality.  CTA head and neck with perfusion showed left carotid stenosis status post stent with only 20% residual stenosis, mild right carotid plaque, dominant right vertebral artery. Vascular surgery consulted with no additional intervention needed just to ensure patient is taking his antiplatelet regimen.  Neurology consulted, MRI ordered.  Assessment & Plan:   Principal Problem:   Stroke [facility]) Active Problems:   Carotid stenosis, left   Anxiety disorder   Essential hypertension   Pure hypercholesterolemia   Type 2 diabetes mellitus with other specified complication (HCC)  Acute ischemic CVA, POA > CT head and CTA head and neck in the ED showed no acute abnormality.  Cyst and residual carotid disease but is status post into the left with only 20% residual stenosis. MRI brain shows Scattered patchy acute ischemic infarcts involving the cortical to subcortical aspect of the left cerebral hemisphere, watershed in distribution. Minimal associated petechial blood products without hemorrhagic transformation or significant regional mass effect. - Allow for permissive HTN  (systolic < 220 and diastolic < 120) - Continue home aspirin  and Plavix  for now  - LDL only 50 but HDL low at 33, continue home statin, await neurology recommendations - Echocardiogram recently done, will not repeat - Has recent A1C 6.8.  PT OT consulted. Per neurology, patient appears to have watershed  strokes on the same side where he recently had TCAR just 3 weeks ago.  Per neurology, they are suspecting thromboembolism at that site and recommended vascular surgery consult.  I have consulted Dr. Gretta of vascular surgery.   Hypertension PTA medications include amlodipine  and valsartan holding antihypertensives in the setting as above   Hyperlipidemia - Continue home atorvastatin    Anxiety - Continue Lexapro    Diabetes melitis type II > 16 units long-acting daily at home along with metformin  and Jardiance . Currently started on 5 units of Lantus , will increase that to 10 units, continue SSI.  DVT prophylaxis: SCD's Start: 12/25/23 1358   Code Status: Full Code  Family Communication:  None present at bedside.  Plan of care discussed with patient in length and he/she verbalized understanding and agreed with it.  Status is: Observation The patient will require care spanning > 2 midnights and should be moved to inpatient because: Needs evaluation by PT OT as well as vascular surgery   Estimated body mass index is 25.9 kg/m as calculated from the following:   Height as of this encounter: 5' 10.98 (1.803 m).   Weight as of this encounter: 84.2 kg.    Nutritional Assessment: Body mass index is 25.9 kg/m.SABRA Seen by dietician.  I agree with the assessment and plan as outlined below: Nutrition Status:        . Skin Assessment: I have examined the patient's skin and I agree with the wound assessment as performed by the wound care RN as outlined below:    Consultants:  Neurology and vascular surgery  Procedures:  As above  Antimicrobials:  Anti-infectives (From admission, onward)    None  Subjective: Patient seen and examined.  He says that his right arm weakness has improved and he is back to baseline.  He did not have any other complaint.  Objective: Vitals:   12/26/23 1524 12/26/23 2014 12/27/23 0036 12/27/23 0426  BP:  (!) 152/95 135/84 (!) 151/94   Pulse:  75  74  Resp:  (!) 21 17   Temp:  98.4 F (36.9 C)  97.7 F (36.5 C)  TempSrc:  Oral  Oral  SpO2:    94%  Weight: 84.2 kg     Height: 5' 10.98 (1.803 m)       Intake/Output Summary (Last 24 hours) at 12/27/2023 0730 Last data filed at 12/27/2023 0430 Gross per 24 hour  Intake 487.92 ml  Output 500 ml  Net -12.08 ml   Filed Weights   12/25/23 1121 12/26/23 1524  Weight: 88.5 kg 84.2 kg    Examination:  General exam: Appears calm and comfortable  Respiratory system: Clear to auscultation. Respiratory effort normal. Cardiovascular system: S1 & S2 heard, RRR. No JVD, murmurs, rubs, gallops or clicks. No pedal edema. Gastrointestinal system: Abdomen is nondistended, soft and nontender. No organomegaly or masses felt. Normal bowel sounds heard. Central nervous system: Alert and oriented. No focal neurological deficits. Extremities: Symmetric 5 x 5 power. Skin: No rashes, lesions or ulcers Psychiatry: Judgement and insight appear normal. Mood & affect appropriate.    Data Reviewed: I have personally reviewed following labs and imaging studies  CBC: Recent Labs  Lab 12/25/23 1136 12/25/23 1140 12/26/23 0241 12/27/23 0524  WBC 11.5*  --  9.0 7.6  NEUTROABS 8.8*  --   --   --   HGB 14.3 14.6 13.8 14.2  HCT 41.9 43.0 41.1 41.0  MCV 88.4  --  89.9 88.0  PLT 289  --  268 255   Basic Metabolic Panel: Recent Labs  Lab 12/25/23 1136 12/25/23 1140 12/26/23 0241  NA 136 139 140  K 4.1 4.1 3.9  CL 102 105 103  CO2 20*  --  24  GLUCOSE 133* 139* 117*  BUN 17 18 16   CREATININE 0.83 0.90 0.76  CALCIUM  9.7  --  9.4   GFR: Estimated Creatinine Clearance: 94.1 mL/min (by C-G formula based on SCr of 0.76 mg/dL). Liver Function Tests: Recent Labs  Lab 12/25/23 1136 12/26/23 0241  AST 28 23  ALT 47* 39  ALKPHOS 51 47  BILITOT 0.3 0.4  PROT 7.6 7.2  ALBUMIN 4.7 4.4   No results for input(s): LIPASE, AMYLASE in the last 168 hours. No results for  input(s): AMMONIA in the last 168 hours. Coagulation Profile: Recent Labs  Lab 12/25/23 1136  INR 1.0   Cardiac Enzymes: No results for input(s): CKTOTAL, CKMB, CKMBINDEX, TROPONINI in the last 168 hours. BNP (last 3 results) No results for input(s): PROBNP in the last 8760 hours. HbA1C: No results for input(s): HGBA1C in the last 72 hours. CBG: Recent Labs  Lab 12/25/23 2227 12/26/23 0741 12/26/23 1258 12/26/23 1610 12/26/23 2057  GLUCAP 120* 130* 144* 115* 111*   Lipid Profile: Recent Labs    12/26/23 0241  CHOL 108  HDL 33*  LDLCALC 50  TRIG 875  CHOLHDL 3.3   Thyroid Function Tests: No results for input(s): TSH, T4TOTAL, FREET4, T3FREE, THYROIDAB in the last 72 hours. Anemia Panel: No results for input(s): VITAMINB12, FOLATE, FERRITIN, TIBC, IRON, RETICCTPCT in the last 72 hours. Sepsis Labs: No results for input(s): PROCALCITON, LATICACIDVEN in the last 168 hours.  No results found for this or any previous visit (from the past 240 hours).   Radiology Studies: MR BRAIN WO CONTRAST Result Date: 12/25/2023 CLINICAL DATA:  Initial evaluation for acute neuro deficit, stroke suspected. EXAM: MRI HEAD WITHOUT CONTRAST TECHNIQUE: Multiplanar, multiecho pulse sequences of the brain and surrounding structures were obtained without intravenous contrast. COMPARISON:  CT from earlier the same day. FINDINGS: Brain: Cerebral volume within normal limits. Patchy T2/FLAIR hyperintensity involving the periventricular and deep white matter both cerebral hemispheres, consistent with chronic small vessel ischemic disease, mild-to-moderate in nature. Scattered patchy restricted diffusion seen involving the cortical to subcortical aspect of the left cerebral hemisphere, with involving the left frontal, parietal, and occipital lobes, with minimal temporal lobe involvement. Findings consistent with small acute ischemic infarcts, watershed in  distribution. Minimal associated petechial blood products without hemorrhagic transformation or significant regional mass effect. Gray-white matter differentiation otherwise maintained. No other acute or chronic intracranial hemorrhage. No mass lesion or midline shift. No hydrocephalus or extra-axial fluid collection. Pituitary gland within normal limits. Vascular: Major intracranial vascular flow voids are maintained. Skull and upper cervical spine: Craniocervical junction within limits. Bone marrow signal intensity normal. No scalp soft tissue abnormality. Sinuses/Orbits: Prior bilateral ocular lens replacement. Paranasal sinuses are clear. Small right mastoid effusion noted, of doubtful significance. Other: None. IMPRESSION: 1. Scattered patchy acute ischemic infarcts involving the cortical to subcortical aspect of the left cerebral hemisphere, watershed in distribution. Minimal associated petechial blood products without hemorrhagic transformation or significant regional mass effect. 2. Underlying mild-to-moderate chronic microvascular ischemic disease. Electronically Signed   By: Morene Hoard M.D.   On: 12/25/2023 19:26   CT ANGIO HEAD NECK W WO CM W PERF (CODE STROKE) Result Date: 12/25/2023 EXAM: CTA Head and Neck with Perfusion 12/25/2023 11:47:46 AM TECHNIQUE: CTA of the head and neck was performed without and with the administration of 100 mL of iohexol  (OMNIPAQUE ) 350 MG/ML injection. 3D postprocessing with multiplanar reconstructions and MIPs was performed to evaluate the vascular anatomy. Cerebral perfusion analysis using computed tomography with contrast administration, including post-processing of parametric maps with determination of cerebral blood flow, cerebral blood volume, mean transit time and time-to-maximum. Automated exposure control, iterative reconstruction, and/or weight based adjustment of the mA/kV was utilized to reduce the radiation dose to as low as reasonably achievable.  COMPARISON: CT of the head dated 12/25/2023 and a CT angiogram of the head and neck dated 11/17/2023. CLINICAL HISTORY: Neuro deficit, acute, stroke suspected. FINDINGS: CTA NECK: AORTIC ARCH AND ARCH VESSELS: No dissection or arterial injury. There is common origin of the brachiocephalic artery and left common carotid artery from the aortic arch. The arch demonstrates mild calcific atheromatous disease. No significant stenosis of the brachiocephalic or subclavian arteries. CERVICAL CAROTID ARTERIES: No dissection or arterial injury. Since the previous CT angiogram, there has been stenting of the cervical segment of the left internal carotid artery. The stent is patent with mild residual stenosis, less than 20%. There is mild calcific plaque present within the proximal right internal carotid artery, with no associated stenosis. No hemodynamically significant stenosis by NASCET criteria. CERVICAL VERTEBRAL ARTERIES: No dissection or arterial injury. The right vertebral artery is dominant and the left is mildly hypoplastic. No significant stenosis. LUNGS AND MEDIASTINUM: Unremarkable. SOFT TISSUES: No acute abnormality. BONES: No acute abnormality. CTA HEAD: ANTERIOR CIRCULATION: No significant stenosis of the intracranial internal carotid arteries. No significant stenosis of the anterior cerebral arteries. No significant stenosis of the middle cerebral arteries. No aneurysm. POSTERIOR CIRCULATION: There  is fetal origin of the right posterior cerebral artery. No significant stenosis of the posterior cerebral arteries. No significant stenosis of the basilar artery. No significant stenosis of the vertebral arteries. No aneurysm. OTHER: No dural venous sinus thrombosis on this non-dedicated study. CT PERFUSION: EXAM QUALITY: Exam quality is adequate with diagnostic perfusion maps. No significant motion artifact. Appropriate arterial inflow and venous outflow curves. CORE INFARCT (CBF<30% volume): 0 mL TOTAL HYPOPERFUSION  (Tmax>6s volume): 0 mL PENUMBRA: Mismatch volume: 0 mL Mismatch ratio: not applicable Location: not applicable Please note in the report that the above findings were communicated to Doctor Eulah Dover at 11:56 AM 12/25/2023. IMPRESSION: 1. Patent stent in the cervical segment of the left internal carotid artery with mild residual stenosis, less than 20%. 2. Mild calcific plaque in the proximal right internal carotid artery without associated stenosis. 3. Dominant right vertebral artery and mildly hypoplastic left vertebral artery; otherwise unremarkable vertebrobasilar system. 4. Fetal origin of the right posterior cerebral artery. 5. Common origin of the brachiocephalic artery and left common carotid artery from the aortic arch. 6. 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 7. Findings communicated to Dr. Michaela at 11:56 AM on 12/25/23. Electronically signed by: Evalene Coho MD 12/25/2023 12:03 PM EST RP Workstation: [id number]   CT HEAD CODE STROKE WO CONTRAST Result Date: 12/25/2023 EXAM: CT HEAD WITHOUT CONTRAST 12/25/2023 11:30:39 AM TECHNIQUE: CT of the head was performed without the administration of intravenous contrast. Automated exposure control, iterative reconstruction, and/or weight based adjustment of the mA/kV was utilized to reduce the radiation dose to as low as reasonably achievable. COMPARISON: CT head without contrast 11/17/2023. Periventricular and subcortical white matter hypoattenuation is moderately advanced for age. No acute or focal cortical abnormalities are present. CLINICAL HISTORY: Neuro deficit, acute, stroke suspected. Right-sided weakness. FINDINGS: BRAIN AND VENTRICLES: No acute hemorrhage. No evidence of acute infarct. No hydrocephalus. No extra-axial collection. No mass effect or midline shift. Periventricular and subcortical white matter hypoattenuation is moderately advanced for age. No acute or focal cortical abnormalities are present. [place] Stroke  Program Early CT Score (ASPECTS) ----- Ganglionic (caudate, ic, lentiform nucleus, insula, M1-m3): 7 Supraganglionic (m4-m6): 3 Total: 10 ORBITS: No acute abnormality. SINUSES: No acute abnormality. SOFT TISSUES AND SKULL: No acute soft tissue abnormality. No skull fracture. IMPRESSION: 1. No acute intracranial abnormality. ASPECTS score 10. 2. Moderately advanced periventricular and subcortical white matter hypoattenuation for age. 3. these results were communicated to Dr. Michaela at 11:35 AM on 12/25/2023 by secure text page via the [facility] messaging system. Electronically signed by: Lonni Necessary MD 12/25/2023 11:35 AM EST RP Workstation: [id number]    Scheduled Meds:  atorvastatin   80 mg Oral Daily   clopidogrel   75 mg Oral Daily   escitalopram   5 mg Oral Daily   insulin  aspart  0-15 Units Subcutaneous TID WC   insulin  glargine  10 Units Subcutaneous Daily   Continuous Infusions:  heparin  1,150 Units/hr (12/26/23 2239)     LOS: 1 day   Jimmy LITTIE Piety, MD [facility]  12/27/2023, 7:30 AM   *Please note that this is a verbal dictation therefore any spelling or grammatical errors are due to the Dragon Medical One system interpretation.  Please page via Amion and do not message via secure chat for urgent patient care matters. Secure chat can be used for non urgent patient care matters.  How to contact the [facility] Attending or Consulting provider 7A - 7P or covering provider during after hours 7P -7A, for this  patient?  Check the care team in [facility] and look for a) attending/consulting [facility] provider listed and b) the [facility] team listed. Page or secure chat 7A-7P. Log into [email or web address] and use [facility]'s universal password to access. If you do not have the password, please contact the hospital operator. Locate the [facility] provider you are looking for under [facility] and page to a number that you can be directly reached. If you still have difficulty reaching the provider,  please page the [facility] (Director on Call) for the Hospitalists listed on amion for assistance.  "

## 2023-12-27 NOTE — Progress Notes (Signed)
 Physical Therapy Treatment Patient Details Name: Jimmy Callahan MRN: [id number] DOB: 1955-02-03 Today's Date: 12/27/2023   History of Present Illness Pt is a 68 year old man who presented to [facility] on 12/25/23 with R side weakness and word finding deficits. 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 PMH: recent TCAR 12/05/23, HTN, HLD, DM, anxiety, CAD, PVD.    PT Comments  Pt demonstrates improved safety awareness while ambulating with rollator this session. Pt increased scanning of his environment, demonstrating increased attention to the R side with only one instance of running into obstacles in the room. Pt demonstrates good balance with head turns and sudden stops while ambulating, as well as static balance tasks in the room with decreased BOS. Pt is requiring decreased level of support needed to safely ambulate with rollator at this time. Pt's girlfriend home 24/7 if light support is needed. Pt will benefit from rollator or rolling walker at home to continue to assist with balance. Pt would benefit from continued PT services focused on progressing gait, balance, and safety awareness to promote safe, independent mobility.    If plan is discharge home, recommend the following: A little help with walking and/or transfers;Assistance with cooking/housework;Assist for transportation;Help with stairs or ramp for entrance;Supervision due to cognitive status   Can travel by private vehicle        Equipment Recommendations  Rollator (4 wheels)    Recommendations for Other Services       Precautions / Restrictions Precautions Precautions: Fall Recall of Precautions/Restrictions: Impaired Restrictions Weight Bearing Restrictions Per Provider Order: No     Mobility  Bed Mobility Overal bed mobility: Modified Independent Bed Mobility: Supine to Sit     Supine to sit: Supervision     General bed mobility comments: Uses bed rails as needed for bed mobility, no assist needed     Transfers Overall transfer level: Needs assistance Equipment used: Rollator (4 wheels) Transfers: Sit to/from Stand Sit to Stand: Contact guard assist           General transfer comment: Pt mildly unsteady upon standing, does well with rollator. Does not demonstrate appropriate break mechanics on rollator prior to completing transfer.    Ambulation/Gait Ambulation/Gait assistance: Contact guard assist Gait Distance (Feet): 300 Feet Assistive device: Rollator (4 wheels) Gait Pattern/deviations: Step-through pattern, Decreased step length - right, Decreased dorsiflexion - right, Decreased step length - left, Drifts right/left   Gait velocity interpretation: 1.31 - 2.62 ft/sec, indicative of limited community ambulator   General Gait Details: Pt does better to navigate halls while maintaining a staight line of progression. Occasionally veers R/L but is able to correct without cues. Pt able to demonstrate sudden stops and head turns with ambulation. Pt doing better job scanning environment while ambulating.   Stairs             Wheelchair Mobility     Tilt Bed    Modified Rankin (Stroke Patients Only)       Balance Overall balance assessment: Needs assistance Sitting-balance support: No upper extremity supported Sitting balance-Leahy Scale: Good Sitting balance - Comments: No sitting balance concerns   Standing balance support: Bilateral upper extremity supported Standing balance-Leahy Scale: Good Standing balance comment: Pt demonstrates improved balance with B UE support. Increased difficulty with turns but improves with decreased pace. Single Leg Stance - Right Leg: 10 Single Leg Stance - Left Leg: 10 Tandem Stance - Right Leg: 25 Tandem Stance - Left Leg: 30 Rhomberg - Eyes Opened: 30 Rhomberg -  Eyes Closed: 30 High level balance activites: Sudden stops, Head turns High Level Balance Comments: Minimal decrease in pace when turning head while ambulating.  No difficulty with sudden stop. Light hand held assist to establish position needed for balance tasks, CGA while balancing.            Communication Communication Communication: No apparent difficulties  Cognition Arousal: Alert Behavior During Therapy: WFL for tasks assessed/performed   PT - Cognitive impairments: Awareness, Safety/Judgement, Attention                         Following commands: Intact      Cueing Cueing Techniques: Verbal cues, Tactile cues, Visual cues  Exercises      General Comments General comments (skin integrity, edema, etc.): HR to 130s with activity, no new skin abnormalities noted.      Pertinent Vitals/Pain Pain Assessment Pain Assessment: No/denies pain    Home Living                          Prior Function            PT Goals (current goals can now be found in the care plan section) Acute Rehab PT Goals Patient Stated Goal: No new goals stated during session. Progress towards PT goals: Progressing toward goals    Frequency    Min 3X/week      PT Plan      Co-evaluation              AM-PAC PT 6 Clicks Mobility   Outcome Measure  Help needed turning from your back to your side while in a flat bed without using bedrails?: None Help needed moving from lying on your back to sitting on the side of a flat bed without using bedrails?: None Help needed moving to and from a bed to a chair (including a wheelchair)?: A Little Help needed standing up from a chair using your arms (e.g., wheelchair or bedside chair)?: A Little Help needed to walk in hospital room?: A Little Help needed climbing 3-5 steps with a railing? : A Lot 6 Click Score: 19    End of Session   Activity Tolerance: Patient tolerated treatment well Patient left: in chair;with call bell/phone within reach Nurse Communication: Mobility status PT Visit Diagnosis: Unsteadiness on feet (R26.81);Other abnormalities of gait and mobility  (R26.89) Hemiplegia - Right/Left: Right Hemiplegia - dominant/non-dominant: Dominant Hemiplegia - caused by: Cerebral infarction     Time: 0851-0910 PT Time Calculation (min) (ACUTE ONLY): 19 min  Charges:    $Gait Training: 8-22 mins PT General Charges $$ ACUTE PT VISIT: 1 Visit                     Sabra Morel, PT, DPT  Acute Rehabilitation Services         Office: [phone]      Sabra MARLA Morel 12/27/2023, 10:00 AM

## 2023-12-27 NOTE — Plan of Care (Signed)
" °  Problem: Education: Goal: Knowledge of disease or condition will improve Outcome: Progressing Goal: Knowledge of secondary prevention will improve (MUST DOCUMENT ALL) Outcome: Progressing   Problem: Ischemic Stroke/TIA Tissue Perfusion: Goal: Complications of ischemic stroke/TIA will be minimized Outcome: Progressing   Problem: Coping: Goal: Will verbalize positive feelings about self Outcome: Progressing Goal: Will identify appropriate support needs Outcome: Progressing   Problem: Self-Care: Goal: Ability to communicate needs accurately will improve Outcome: Progressing   "

## 2023-12-27 NOTE — Discharge Summary (Signed)
 "  Physician Discharge Summary  Patient: Jimmy Callahan FMW:[id number] DOB: May 27, 1955   Code Status: Full Code Admit date: 12/25/2023 Discharge date: 12/27/2023 Disposition: Home health, PT and OT PCP: Regino Slater, MD  Recommendations for Outpatient Follow-up:  Follow up with PCP within 1-2 weeks Regarding general hospital follow up and preventative care Follow up with neurology  Follow up with vascular surgery  Discharge Diagnoses:  Principal Problem:   Stroke [facility]) Active Problems:   Carotid stenosis, left   Anxiety disorder   Essential hypertension   Pure hypercholesterolemia   Type 2 diabetes mellitus with other specified complication (HCC)   Acute right-sided weakness  Brief Hospital Course Summary: Jimmy Callahan is a 68 y.o. male with medical history significant of hypertension, hyperlipidemia, diabetes, anxiety, carotid artery disease presented with right-sided weakness. Patient woke up with right-sided weakness and some word finding difficulty.     Upon arrival to ED, hemodynamically stable. CT head showed no acute abnormality.  CTA head and neck with perfusion showed left carotid stenosis status post stent with only 20% residual stenosis, mild right carotid plaque, dominant right vertebral artery. Vascular surgery consulted with no additional intervention needed just to ensure patient is taking his antiplatelet regimen. MRI brain showed: Scattered patchy acute ischemic infarcts involving the cortical to subcortical aspect of the left cerebral hemisphere, watershed in distribution. Minimal associated petechial blood products without hemorrhagic transformation or significant regional mass effect. Initially was started on heparin  gtt in setting of recent TCAR. Vascular surgery consulted    Neurology consulted. Eventually recommended to be discharged on plavix  and eliquis .  Echo already recently completed.  PT/OT evaluated and recommended HH.   All other chronic  conditions were treated with home medications.    Discharge Condition: Good, improved Recommended discharge diet: Regular healthy diet  Consultations: Vascular surgery  Neurology   Procedures/Studies: Brain MRI  Discharge Instructions     Ambulatory referral to Neurology   Complete by: As directed    An appointment is requested in approximately: 4 weeks Post-stroke follow up   Discharge patient   Complete by: As directed    Discharge disposition: 06-Home-Health Care Svc   Discharge patient date: 12/27/2023      Allergies as of 12/27/2023       Reactions   Paroxetine Hcl    Other Reaction(s): nausea,lightheaded   Telmisartan-hctz    Other Reaction(s): orthostatic/lightheadedness        Medication List     STOP taking these medications    Aspirin  Low Dose 81 MG tablet Generic drug: aspirin  EC       TAKE these medications    acetaminophen  650 MG suppository Commonly known as: TYLENOL  Place 650 mg rectally every 4 (four) hours as needed for mild pain (pain score 1-3) or moderate pain (pain score 4-6).   amLODipine  10 MG tablet Commonly known as: NORVASC  Take 10 mg by mouth daily.   apixaban  5 MG Tabs tablet Commonly known as: ELIQUIS  Take 1 tablet (5 mg total) by mouth 2 (two) times daily.   atorvastatin  80 MG tablet Commonly known as: LIPITOR Take 80 mg by mouth daily.   clopidogrel  75 MG tablet Commonly known as: PLAVIX  Take 1 tablet (75 mg total) by mouth daily. Start taking on: December 28, 2023   escitalopram  5 MG tablet Commonly known as: LEXAPRO  Take 5 mg by mouth daily.   Iron 325 (65 Fe) MG Tabs Take 1 tablet by mouth daily at 12 noon.   Jardiance  25 MG Tabs  tablet Generic drug: empagliflozin  Take 25 mg by mouth daily.   Lantus  SoloStar 100 UNIT/ML Solostar Pen Generic drug: insulin  glargine Inject 16 Units into the skin daily.   metFORMIN  1000 MG tablet Commonly known as: GLUCOPHAGE  Take 1,000 mg by mouth 2 (two) times daily  with a meal.   POTASSIUM CHLORIDE  PO Take 3 tablets by mouth daily. 99mg    QUNOL ULTRA COQ10 PO Take 1 tablet by mouth daily.   valsartan 320 MG tablet Commonly known as: DIOVAN Take 320 mg by mouth daily.   Vitamin D3 25 MCG Caps Take 25 mcg by mouth daily at 12 noon.               Durable Medical Equipment  (From admission, onward)           Start     Ordered   12/27/23 1105  For home use only DME Walker rolling  Once       Question Answer Comment  Walker: With 5 Inch Wheels   Patient needs a walker to treat with the following condition Weakness      12/27/23 1106            Contact information for after-discharge care     Home Medical Care     [facility]) .   Service: Home Health Services Contact information: [address] [place]  [address] [phone]                     Subjective   Pt reports feeling well. His states that his balance and strength is improving. No complaints or concerns at this time.   All questions and concerns were addressed at time of discharge.  Objective  Blood pressure (!) 151/94, pulse 74, temperature (!) 97.4 F (36.3 C), temperature source Oral, resp. rate 17, height 5' 10.98 (1.803 m), weight 84.2 kg, SpO2 94%.   General: Pt is alert, awake, not in acute distress Cardiovascular: RRR, S1/S2 +, no rubs, no gallops Respiratory: CTA bilaterally, no wheezing, no rhonchi Abdominal: Soft, NT, ND, bowel sounds + Extremities: no edema, no cyanosis  The results of significant diagnostics from this hospitalization (including imaging, microbiology, ancillary and laboratory) are listed below for reference.   Imaging studies: MR BRAIN WO CONTRAST Result Date: 12/25/2023 CLINICAL DATA:  Initial evaluation for acute neuro deficit, stroke suspected. EXAM: MRI HEAD WITHOUT CONTRAST TECHNIQUE: Multiplanar, multiecho pulse sequences of the brain and surrounding  structures were obtained without intravenous contrast. COMPARISON:  CT from earlier the same day. FINDINGS: Brain: Cerebral volume within normal limits. Patchy T2/FLAIR hyperintensity involving the periventricular and deep white matter both cerebral hemispheres, consistent with chronic small vessel ischemic disease, mild-to-moderate in nature. Scattered patchy restricted diffusion seen involving the cortical to subcortical aspect of the left cerebral hemisphere, with involving the left frontal, parietal, and occipital lobes, with minimal temporal lobe involvement. Findings consistent with small acute ischemic infarcts, watershed in distribution. Minimal associated petechial blood products without hemorrhagic transformation or significant regional mass effect. Gray-white matter differentiation otherwise maintained. No other acute or chronic intracranial hemorrhage. No mass lesion or midline shift. No hydrocephalus or extra-axial fluid collection. Pituitary gland within normal limits. Vascular: Major intracranial vascular flow voids are maintained. Skull and upper cervical spine: Craniocervical junction within limits. Bone marrow signal intensity normal. No scalp soft tissue abnormality. Sinuses/Orbits: Prior bilateral ocular lens replacement. Paranasal sinuses are clear. Small right mastoid effusion noted, of doubtful significance. Other:  None. IMPRESSION: 1. Scattered patchy acute ischemic infarcts involving the cortical to subcortical aspect of the left cerebral hemisphere, watershed in distribution. Minimal associated petechial blood products without hemorrhagic transformation or significant regional mass effect. 2. Underlying mild-to-moderate chronic microvascular ischemic disease. Electronically Signed   By: Morene Hoard M.D.   On: 12/25/2023 19:26   CT ANGIO HEAD NECK W WO CM W PERF (CODE STROKE) Result Date: 12/25/2023 EXAM: CTA Head and Neck with Perfusion 12/25/2023 11:47:46 AM TECHNIQUE: CTA of  the head and neck was performed without and with the administration of 100 mL of iohexol  (OMNIPAQUE ) 350 MG/ML injection. 3D postprocessing with multiplanar reconstructions and MIPs was performed to evaluate the vascular anatomy. Cerebral perfusion analysis using computed tomography with contrast administration, including post-processing of parametric maps with determination of cerebral blood flow, cerebral blood volume, mean transit time and time-to-maximum. Automated exposure control, iterative reconstruction, and/or weight based adjustment of the mA/kV was utilized to reduce the radiation dose to as low as reasonably achievable. COMPARISON: CT of the head dated 12/25/2023 and a CT angiogram of the head and neck dated 11/17/2023. CLINICAL HISTORY: Neuro deficit, acute, stroke suspected. FINDINGS: CTA NECK: AORTIC ARCH AND ARCH VESSELS: No dissection or arterial injury. There is common origin of the brachiocephalic artery and left common carotid artery from the aortic arch. The arch demonstrates mild calcific atheromatous disease. No significant stenosis of the brachiocephalic or subclavian arteries. CERVICAL CAROTID ARTERIES: No dissection or arterial injury. Since the previous CT angiogram, there has been stenting of the cervical segment of the left internal carotid artery. The stent is patent with mild residual stenosis, less than 20%. There is mild calcific plaque present within the proximal right internal carotid artery, with no associated stenosis. No hemodynamically significant stenosis by NASCET criteria. CERVICAL VERTEBRAL ARTERIES: No dissection or arterial injury. The right vertebral artery is dominant and the left is mildly hypoplastic. No significant stenosis. LUNGS AND MEDIASTINUM: Unremarkable. SOFT TISSUES: No acute abnormality. BONES: No acute abnormality. CTA HEAD: ANTERIOR CIRCULATION: No significant stenosis of the intracranial internal carotid arteries. No significant stenosis of the anterior  cerebral arteries. No significant stenosis of the middle cerebral arteries. No aneurysm. POSTERIOR CIRCULATION: There is fetal origin of the right posterior cerebral artery. No significant stenosis of the posterior cerebral arteries. No significant stenosis of the basilar artery. No significant stenosis of the vertebral arteries. No aneurysm. OTHER: No dural venous sinus thrombosis on this non-dedicated study. CT PERFUSION: EXAM QUALITY: Exam quality is adequate with diagnostic perfusion maps. No significant motion artifact. Appropriate arterial inflow and venous outflow curves. CORE INFARCT (CBF<30% volume): 0 mL TOTAL HYPOPERFUSION (Tmax>6s volume): 0 mL PENUMBRA: Mismatch volume: 0 mL Mismatch ratio: not applicable Location: not applicable Please note in the report that the above findings were communicated to Doctor Eulah Dover at 11:56 AM 12/25/2023. IMPRESSION: 1. Patent stent in the cervical segment of the left internal carotid artery with mild residual stenosis, less than 20%. 2. Mild calcific plaque in the proximal right internal carotid artery without associated stenosis. 3. Dominant right vertebral artery and mildly hypoplastic left vertebral artery; otherwise unremarkable vertebrobasilar system. 4. Fetal origin of the right posterior cerebral artery. 5. Common origin of the brachiocephalic artery and left common carotid artery from the aortic arch. 6. 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 7. Findings communicated to Dr. Michaela at 11:56 AM on 12/25/23. Electronically signed by: Evalene Coho MD 12/25/2023 12:03 PM EST RP Workstation: [id number]   CT HEAD CODE STROKE  WO CONTRAST Result Date: 12/25/2023 EXAM: CT HEAD WITHOUT CONTRAST 12/25/2023 11:30:39 AM TECHNIQUE: CT of the head was performed without the administration of intravenous contrast. Automated exposure control, iterative reconstruction, and/or weight based adjustment of the mA/kV was utilized to reduce the radiation  dose to as low as reasonably achievable. COMPARISON: CT head without contrast 11/17/2023. Periventricular and subcortical white matter hypoattenuation is moderately advanced for age. No acute or focal cortical abnormalities are present. CLINICAL HISTORY: Neuro deficit, acute, stroke suspected. Right-sided weakness. FINDINGS: BRAIN AND VENTRICLES: No acute hemorrhage. No evidence of acute infarct. No hydrocephalus. No extra-axial collection. No mass effect or midline shift. Periventricular and subcortical white matter hypoattenuation is moderately advanced for age. No acute or focal cortical abnormalities are present. [place] Stroke Program Early CT Score (ASPECTS) ----- Ganglionic (caudate, ic, lentiform nucleus, insula, M1-m3): 7 Supraganglionic (m4-m6): 3 Total: 10 ORBITS: No acute abnormality. SINUSES: No acute abnormality. SOFT TISSUES AND SKULL: No acute soft tissue abnormality. No skull fracture. IMPRESSION: 1. No acute intracranial abnormality. ASPECTS score 10. 2. Moderately advanced periventricular and subcortical white matter hypoattenuation for age. 3. these results were communicated to Dr. Michaela at 11:35 AM on 12/25/2023 by secure text page via the [facility] messaging system. Electronically signed by: Lonni Necessary MD 12/25/2023 11:35 AM EST RP Workstation: [id number]   DG C-Arm 1-60 Min Result Date: 12/05/2023 EXAM: XR Intraoperative Imaging C arm, 1 view. TECHNIQUE: Fluoroscopy was provided by the radiology department for procedure. Radiologist was not present during examination. FLUOROSCOPY DOSE AND TYPE: Radiation Dose Index: Reference Air Kerma (in mGy) = 55.73. Fluoroscopy time = 3 minutes 57 seconds. COMPARISON: None available. CLINICAL HISTORY: Surgery. FINDINGS: Intraoperative fluoroscopic imaging was performed during Left transcarotid artery revascularization procedure. IMPRESSION: 1. Intraprocedural fluoroscopic images obtained during left transcarotid artery revascularization  procedure. NOTE: Please refer to the intraoperative report for full details. Electronically signed by: Norman Gatlin MD 12/05/2023 09:55 PM EST RP Workstation: [id number]    Labs: Basic Metabolic Panel: Recent Labs  Lab 12/25/23 1136 12/25/23 1140 12/26/23 0241  NA 136 139 140  K 4.1 4.1 3.9  CL 102 105 103  CO2 20*  --  24  GLUCOSE 133* 139* 117*  BUN 17 18 16   CREATININE 0.83 0.90 0.76  CALCIUM  9.7  --  9.4   CBC: Recent Labs  Lab 12/25/23 1136 12/25/23 1140 12/26/23 0241 12/27/23 0524  WBC 11.5*  --  9.0 7.6  NEUTROABS 8.8*  --   --   --   HGB 14.3 14.6 13.8 14.2  HCT 41.9 43.0 41.1 41.0  MCV 88.4  --  89.9 88.0  PLT 289  --  268 255   Microbiology: Results for orders placed or performed during the hospital encounter of 11/30/23  Surgical pcr screen     Status: None   Collection Time: 11/30/23 12:10 PM   Specimen: Nasal Mucosa; Nasal Swab  Result Value Ref Range Status   MRSA, PCR NEGATIVE NEGATIVE Final   Staphylococcus aureus NEGATIVE NEGATIVE Final    Comment: (NOTE) The Xpert SA Assay (FDA approved for NASAL specimens in patients 56 years of age and older), is one component of a comprehensive surveillance program. It is not intended to diagnose infection nor to guide or monitor treatment. Performed at [facility], [address] [place], [place] [address]     Time coordinating discharge: Over 30 minutes  Marien LITTIE Piety, MD  [facility] 12/27/2023, 11:06 AM  "

## 2023-12-27 NOTE — TOC Transition Note (Signed)
 Transition of Care [facility]) - Discharge Note   Patient Details  Name: Jimmy Callahan MRN: [id number] Date of Birth: [birth year]  [facility]) CM/SW Contact:  Landry DELENA Senters, RN Phone Number: 12/27/2023, 11:00 AM   Clinical Narrative:     Patient will be discharging home today, with family providing transportation. Patient has PCP, manages own medications.   HH recommendation after updated eval from PT. Patient has no preference for agency. HHPT/OT arranged through [place], Info on AVS.  Therapy rec for rolling walker. Rolling walker ordered through [employer] and to be delivered to patient room before d/c.   No further needs identified by CM.   Final next level of care: Home w Home Health Services Barriers to Discharge: No Barriers Identified   Patient Goals and CMS Choice   CMS Medicare.gov Compare Post Acute Care list provided to:: Patient Choice offered to / list presented to : Patient      Discharge Placement                       Discharge Plan and Services Additional resources added to the After Visit Summary for                            [facility] Arranged: PT, OT [facility] Agency: [facility] Date [facility] Agency Contacted: 12/27/23 Time HH Agency Contacted: 1100 Representative spoke with at [facility] Agency: Joane  Social Drivers of Health (SDOH) Interventions SDOH Screenings   Food Insecurity: No Food Insecurity (12/25/2023)  Housing: Low Risk (12/25/2023)  Transportation Needs: No Transportation Needs (12/25/2023)  Utilities: Not At Risk (12/25/2023)  Social Connections: Moderately Isolated (12/25/2023)  Tobacco Use: Low Risk (12/25/2023)     Readmission Risk Interventions    12/06/2023   11:47 AM  Readmission Risk Prevention Plan  Post Dischage Appt Complete  Medication Screening Complete  Transportation Screening Complete

## 2023-12-27 NOTE — Discharge Instructions (Signed)
 Please take plavix  daily and a new blood thinner called eliquis  twice daily for stroke prevention.  Home health physical therapy will come to work with you.  You will have follow up with vascular surgery and neurology to monitor your stroke recovery and prevention.

## 2023-12-27 NOTE — Progress Notes (Signed)
 PHARMACY - ANTICOAGULATION CONSULT NOTE  Pharmacy Consult for heparin    Indication: stroke, laminar thrombus within carotid stent   Allergies[1]  Patient Measurements: Height: 5' 10.98 (180.3 cm) Weight: 84.2 kg (185 lb 10 oz) IBW/kg (Calculated) : 75.26 HEPARIN  DW (KG): 84.2  Vital Signs: Temp: 97.4 F (36.3 C) (12/23 0756) Temp Source: Oral (12/23 0756) BP: 151/94 (12/23 0426) Pulse Rate: 74 (12/23 0426)  Labs: Recent Labs    12/25/23 1136 12/25/23 1140 12/26/23 0241 12/26/23 2124 12/27/23 0524  HGB 14.3 14.6 13.8  --  14.2  HCT 41.9 43.0 41.1  --  41.0  PLT 289  --  268  --  255  APTT 30  --   --   --   --   LABPROT 13.8  --   --   --   --   INR 1.0  --   --   --   --   HEPARINUNFRC  --   --   --  0.19* 0.30  CREATININE 0.83 0.90 0.76  --   --     Estimated Creatinine Clearance: 94.1 mL/min (by C-G formula based on SCr of 0.76 mg/dL).   Medical History: Past Medical History:  Diagnosis Date   Carotid artery occlusion    Diabetes mellitus without complication (HCC)    Hyperlipidemia    Hypertension     Assessment: Patient presenting with CC of stroke like symptoms. 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 Patient not on anticoagulation PTA (plavix /ASA for stent). Pharmacy consulted to dose heparin .   Heparin  level came back therapeutic this AM but on the lower side. Will increase slightly. Plan to change to apixaban  today.   Goal of Therapy:  Heparin  level goal 0.3-0.5 units/ml Monitor platelets by anticoagulation protocol: Yes   Plan:  Increase heparin  infusion to 1200 units/hr Daily HL F/u PO NOAC  Sergio Batch, PharmD, BCIDP, AAHIVP, CPP Infectious Disease Pharmacist 12/27/2023 9:04 AM          [1]  Allergies Allergen Reactions   Paroxetine Hcl     Other Reaction(s): nausea,lightheaded   Telmisartan-Hctz     Other Reaction(s): orthostatic/lightheadedness

## 2023-12-27 NOTE — Progress Notes (Signed)
 [facility]  Subjective  -no acute events overnight.  Tolerating heparin .   Objective (!) 151/94 74 97.7 F (36.5 C) (Oral) 17 94%  Intake/Output Summary (Last 24 hours) at 12/27/2023 0614 Last data filed at 12/27/2023 0430 Gross per 24 hour  Intake 487.92 ml  Output 500 ml  Net -12.08 ml    Grossly neurologically intact except for right lower extremity 4+ out of 5  Laboratory Lab Results: Recent Labs    12/25/23 1136 12/25/23 1140 12/26/23 0241  WBC 11.5*  --  9.0  HGB 14.3 14.6 13.8  HCT 41.9 43.0 41.1  PLT 289  --  268   BMET Recent Labs    12/25/23 1136 12/25/23 1140 12/26/23 0241  NA 136 139 140  K 4.1 4.1 3.9  CL 102 105 103  CO2 20*  --  24  GLUCOSE 133* 139* 117*  BUN 17 18 16   CREATININE 0.83 0.90 0.76  CALCIUM  9.7  --  9.4    COAG Lab Results  Component Value Date   INR 1.0 12/25/2023   INR 1.0 11/30/2023   No results found for: PTT  Assessment/Planning:   68 year old male that underwent left TCAR on 12/05/2023 for a symptomatic high-grade stenosis with soft atheroma by Dr. Lanis.  Was doing well at home and then Sunday developed right arm and leg weakness with evidence of new stroke with MRI showing left brain infarct on the side the carotid stent was placed.  There is laminar thrombus inside the stent that I suspect is related to the soft atheroma from the initial lesion.  Suspicious this was the etiology for his left brain stroke otherwise the stent is patent.  Recommend heparin  after discussion with neurology to hopefully melt away the thrombus in addition to Plavix .    Tolerating heparin  this morning.  Can transition to DOAC with either Eliquis  or Xarelto and plan DOAC plus Plavix  at discharge.  Discussed he can hold the aspirin  for now.  I will make sure he has short interval follow-up with Dr. Lanis in the office.  OK for discharge from our standpoint.  Lonni JINNY Gaskins 12/27/2023 6:14  AM --

## 2024-01-10 NOTE — Progress Notes (Signed)
 " Office Note     HPI: Jimmy Callahan is a 69 y.o. (1955/10/13) male presenting in follow-up status post 12/05/2023 left TCAR for symptomatic ICA stenosis.  Jimmy Callahan had an uncomplicated hospital course, however 20 days later had another ipsilateral stroke,  On exam, Jimmy Callahan was doing well.  He had no complaints.  Right leg weakness that prompted his presentation to the hospital at the end of December has resolved.  He has had no further TIA, amaurosis, strokelike symptoms.  He has been compliant with his medications.   Plavix , Eliquis , statin He is a non-smoker  Past Medical History:  Diagnosis Date   Carotid artery occlusion    Diabetes mellitus without complication (HCC)    Hyperlipidemia    Hypertension     Past Surgical History:  Procedure Laterality Date   HERNIA REPAIR Bilateral 1971   TRANSCAROTID ARTERY REVASCULARIZATION  Left 12/05/2023   Procedure: LEFT TRANSCAROTID ARTERY REVASCULARIZATION;  Surgeon: Lanis Fonda BRAVO, MD;  Location: [facility] OR;  Service: Vascular;  Laterality: Left;   ULTRASOUND GUIDANCE FOR VASCULAR ACCESS Right 12/05/2023   Procedure: ULTRASOUND GUIDANCE, FOR VASCULAR ACCESS;  Surgeon: Lanis Fonda BRAVO, MD;  Location: [facility] OR;  Service: Vascular;  Laterality: Right;    Social History   Socioeconomic History   Marital status: Single    Spouse name: Not on file   Number of children: Not on file   Years of education: Not on file   Highest education level: Not on file  Occupational History   Not on file  Tobacco Use   Smoking status: Never   Smokeless tobacco: Never  Vaping Use   Vaping status: Never Used  Substance and Sexual Activity   Alcohol use: No   Drug use: Not Currently   Sexual activity: Not on file  Other Topics Concern   Not on file  Social History Narrative   Not on file   Social Drivers of Health   Tobacco Use: Low Risk (12/25/2023)   Patient History    Smoking Tobacco Use: Never    Smokeless Tobacco Use: Never    Passive  Exposure: Not on file  Financial Resource Strain: Not on file  Food Insecurity: No Food Insecurity (12/25/2023)   Epic    Worried About Radiation Protection Practitioner of Food in the Last Year: Never true    Ran Out of Food in the Last Year: Never true  Transportation Needs: No Transportation Needs (12/25/2023)   Epic    Lack of Transportation (Medical): No    Lack of Transportation (Non-Medical): No  Physical Activity: Not on file  Stress: Not on file  Social Connections: Moderately Isolated (12/25/2023)   Social Connection and Isolation Panel    Frequency of Communication with Friends and Family: More than three times a week    Frequency of Social Gatherings with Friends and Family: More than three times a week    Attends Religious Services: Never    Database Administrator or Organizations: No    Attends Banker Meetings: Never    Marital Status: Living with partner  Intimate Partner Violence: Not At Risk (12/25/2023)   Epic    Fear of Current or Ex-Partner: No    Emotionally Abused: No    Physically Abused: No    Sexually Abused: No  Depression (PHQ2-9): Not on file  Alcohol Screen: Not on file  Housing: Low Risk (12/25/2023)   Epic    Unable to Pay for Housing in the Last Year: No  Number of Times Moved in the Last Year: 0    Homeless in the Last Year: No  Utilities: Not At Risk (12/25/2023)   Epic    Threatened with loss of utilities: No  Health Literacy: Not on file  No family history on file.  Current Outpatient Medications  Medication Sig Dispense Refill   acetaminophen  (TYLENOL ) 650 MG suppository Place 650 mg rectally every 4 (four) hours as needed for mild pain (pain score 1-3) or moderate pain (pain score 4-6).     amLODipine  (NORVASC ) 10 MG tablet Take 10 mg by mouth daily.     apixaban  (ELIQUIS ) 5 MG TABS tablet Take 1 tablet (5 mg total) by mouth 2 (two) times daily. 180 tablet 0   atorvastatin  (LIPITOR) 80 MG tablet Take 80 mg by mouth daily.      Cholecalciferol (VITAMIN D3) 25 MCG CAPS Take 25 mcg by mouth daily at 12 noon.     clopidogrel  (PLAVIX ) 75 MG tablet Take 1 tablet (75 mg total) by mouth daily. 90 tablet 0   Coenzyme Q10-Vitamin E (QUNOL ULTRA COQ10 PO) Take 1 tablet by mouth daily.     escitalopram  (LEXAPRO ) 5 MG tablet Take 5 mg by mouth daily.     Ferrous Sulfate  (IRON) 325 (65 Fe) MG TABS Take 1 tablet by mouth daily at 12 noon.     JARDIANCE  25 MG TABS tablet Take 25 mg by mouth daily.     LANTUS  SOLOSTAR 100 UNIT/ML Solostar Pen Inject 16 Units into the skin daily.     metFORMIN  (GLUCOPHAGE ) 1000 MG tablet Take 1,000 mg by mouth 2 (two) times daily with a meal.     POTASSIUM CHLORIDE  PO Take 3 tablets by mouth daily. 99mg      valsartan (DIOVAN) 320 MG tablet Take 320 mg by mouth daily.     No current facility-administered medications for this visit.    Allergies  Allergen Reactions   Paroxetine Hcl     Other Reaction(s): nausea,lightheaded   Telmisartan-Hctz     Other Reaction(s): orthostatic/lightheadedness     REVIEW OF SYSTEMS:  [X]  denotes positive finding, [ ]  denotes negative finding Cardiac  Comments:  Chest pain or chest pressure:    Shortness of breath upon exertion:    Short of breath when lying flat:    Irregular heart rhythm:        Vascular    Pain in calf, thigh, or hip brought on by ambulation:    Pain in feet at night that wakes you up from your sleep:     Blood clot in your veins:    Leg swelling:         Pulmonary    Oxygen at home:    Productive cough:     Wheezing:         Neurologic    Sudden weakness in arms or legs:     Sudden numbness in arms or legs:     Sudden onset of difficulty speaking or slurred speech:    Temporary loss of vision in one eye:     Problems with dizziness:         Gastrointestinal    Blood in stool:     Vomited blood:         Genitourinary    Burning when urinating:     Blood in urine:        Psychiatric    Major depression:          Hematologic  Bleeding problems:    Problems with blood clotting too easily:        Skin    Rashes or ulcers:        Constitutional    Fever or chills:      PHYSICAL EXAMINATION:  There were no vitals filed for this visit.   General:  WDWN in NAD; vital signs documented above Gait: Not observed HENT: WNL, normocephalic Pulmonary: normal non-labored breathing , without wheezing Cardiac: regular HR Abdomen: soft, NT, no masses Skin: without rashes Vascular Exam/Pulses:  Right Left  Radial 2+ (normal) 2+ (normal)                       Extremities: without ischemic changes, without Gangrene , without cellulitis; without open wounds;  Musculoskeletal: no muscle wasting or atrophy  Neurologic: A&O X 3;  No focal weakness or paresthesias are detected Psychiatric:  The pt has Normal affect.   Non-Invasive Vascular Imaging:   Widely patent left carotid stent    ASSESSMENT/PLAN: Jimmy Callahan is a 69 y.o. male presenting in follow-up status post TCAR for symptomatic ICA stenosis.  Case was uncomplicated, the patient was discharged following day.  Unfortunately, he presented 20 days later with repeat stroke.  The stent was found to be widely patent.  There was a small amount of mural thrombus appreciate.  Patient's medications were optimized.  He has had no further events.    I asked that he continue his current medication regimen.  My plan is to see him in 1 years time with repeat imaging.   I asked him to call my office should any questions or concerns arise.     Fonda FORBES Rim, MD [facility] [phone]  "

## 2024-01-12 ENCOUNTER — Encounter: Payer: Self-pay | Admitting: Vascular Surgery

## 2024-01-12 ENCOUNTER — Ambulatory Visit (INDEPENDENT_AMBULATORY_CARE_PROVIDER_SITE_OTHER): Admitting: Vascular Surgery

## 2024-01-12 ENCOUNTER — Ambulatory Visit (HOSPITAL_COMMUNITY)
Admission: RE | Admit: 2024-01-12 | Discharge: 2024-01-12 | Disposition: A | Discharge status: Home / Self Care | Attending: Vascular Surgery | Admitting: Vascular Surgery

## 2024-01-12 VITALS — BP 124/86 | HR 95 | Temp 98.6°F | Resp 18 | Ht 71.0 in | Wt 190.9 lb

## 2024-01-12 DIAGNOSIS — Z95828 Presence of other vascular implants and grafts: Secondary | ICD-10-CM

## 2024-01-12 DIAGNOSIS — I6522 Occlusion and stenosis of left carotid artery: Secondary | ICD-10-CM | POA: Diagnosis present

## 2024-01-16 ENCOUNTER — Other Ambulatory Visit (HOSPITAL_COMMUNITY): Payer: Self-pay
